# Patient Record
Sex: Female | Born: 1950
Health system: Southern US, Community
[De-identification: ages and names within clinical notes are randomized; demographics above are authoritative.]

## PROBLEM LIST (undated history)

## (undated) DIAGNOSIS — H43812 Vitreous degeneration, left eye: Secondary | ICD-10-CM

## (undated) DIAGNOSIS — C801 Malignant (primary) neoplasm, unspecified: Secondary | ICD-10-CM

## (undated) DIAGNOSIS — Z01419 Encounter for gynecological examination (general) (routine) without abnormal findings: Secondary | ICD-10-CM

## (undated) DIAGNOSIS — T7840XA Allergy, unspecified, initial encounter: Secondary | ICD-10-CM

## (undated) DIAGNOSIS — C50919 Malignant neoplasm of unspecified site of unspecified female breast: Secondary | ICD-10-CM

## (undated) DIAGNOSIS — E039 Hypothyroidism, unspecified: Secondary | ICD-10-CM

## (undated) DIAGNOSIS — Z923 Personal history of irradiation: Secondary | ICD-10-CM

## (undated) DIAGNOSIS — K921 Melena: Secondary | ICD-10-CM

## (undated) DIAGNOSIS — E785 Hyperlipidemia, unspecified: Secondary | ICD-10-CM

## (undated) DIAGNOSIS — M810 Age-related osteoporosis without current pathological fracture: Secondary | ICD-10-CM

## (undated) DIAGNOSIS — K219 Gastro-esophageal reflux disease without esophagitis: Secondary | ICD-10-CM

## (undated) HISTORY — DX: Hyperlipidemia, unspecified: E78.5

## (undated) HISTORY — PX: BREAST EXCISIONAL BIOPSY: SUR124

## (undated) HISTORY — DX: Encounter for gynecological examination (general) (routine) without abnormal findings: Z01.419

## (undated) HISTORY — DX: Gastro-esophageal reflux disease without esophagitis: K21.9

## (undated) HISTORY — DX: Malignant (primary) neoplasm, unspecified: C80.1

## (undated) HISTORY — DX: Hypothyroidism, unspecified: E03.9

## (undated) HISTORY — DX: Vitreous degeneration, left eye: H43.812

## (undated) HISTORY — DX: Age-related osteoporosis without current pathological fracture: M81.0

## (undated) HISTORY — PX: BLADDER SURGERY: SHX569

## (undated) HISTORY — DX: Allergy, unspecified, initial encounter: T78.40XA

## (undated) HISTORY — PX: BREAST BIOPSY: SHX20

## (undated) HISTORY — DX: Melena: K92.1

---
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
## 1996-06-27 DIAGNOSIS — C801 Malignant (primary) neoplasm, unspecified: Secondary | ICD-10-CM

## 1996-06-27 HISTORY — DX: Malignant (primary) neoplasm, unspecified: C80.1

## 1996-06-27 HISTORY — PX: BREAST LUMPECTOMY: SHX2

## 1997-10-10 ENCOUNTER — Ambulatory Visit (HOSPITAL_COMMUNITY): Admission: RE | Admit: 1997-10-10 | Discharge: 1997-10-10 | Payer: Self-pay | Admitting: Oncology

## 1997-10-13 ENCOUNTER — Ambulatory Visit (HOSPITAL_COMMUNITY): Admission: RE | Admit: 1997-10-13 | Discharge: 1997-10-13 | Payer: Self-pay | Admitting: *Deleted

## 1997-11-10 ENCOUNTER — Other Ambulatory Visit: Admission: RE | Admit: 1997-11-10 | Discharge: 1997-11-10 | Payer: Self-pay | Admitting: *Deleted

## 1998-03-11 ENCOUNTER — Other Ambulatory Visit: Admission: RE | Admit: 1998-03-11 | Discharge: 1998-03-11 | Payer: Self-pay | Admitting: *Deleted

## 1998-03-30 ENCOUNTER — Ambulatory Visit (HOSPITAL_COMMUNITY): Admission: RE | Admit: 1998-03-30 | Discharge: 1998-03-30 | Payer: Self-pay | Admitting: *Deleted

## 1998-10-14 ENCOUNTER — Ambulatory Visit (HOSPITAL_COMMUNITY): Admission: RE | Admit: 1998-10-14 | Discharge: 1998-10-14 | Payer: Self-pay | Admitting: *Deleted

## 1998-10-14 ENCOUNTER — Encounter: Payer: Self-pay | Admitting: *Deleted

## 1998-11-16 ENCOUNTER — Other Ambulatory Visit: Admission: RE | Admit: 1998-11-16 | Discharge: 1998-11-16 | Payer: Self-pay | Admitting: *Deleted

## 1999-03-29 ENCOUNTER — Encounter: Payer: Self-pay | Admitting: Oncology

## 1999-03-29 ENCOUNTER — Ambulatory Visit (HOSPITAL_COMMUNITY): Admission: RE | Admit: 1999-03-29 | Discharge: 1999-03-29 | Payer: Self-pay | Admitting: Oncology

## 2000-03-01 ENCOUNTER — Encounter: Admission: RE | Admit: 2000-03-01 | Discharge: 2000-03-01 | Payer: Self-pay | Admitting: Family Medicine

## 2000-03-01 ENCOUNTER — Encounter: Payer: Self-pay | Admitting: Family Medicine

## 2000-08-09 ENCOUNTER — Encounter: Admission: RE | Admit: 2000-08-09 | Discharge: 2000-08-09 | Payer: Self-pay | Admitting: Internal Medicine

## 2000-08-09 ENCOUNTER — Encounter (HOSPITAL_BASED_OUTPATIENT_CLINIC_OR_DEPARTMENT_OTHER): Payer: Self-pay | Admitting: General Surgery

## 2001-01-11 ENCOUNTER — Other Ambulatory Visit: Admission: RE | Admit: 2001-01-11 | Discharge: 2001-01-11 | Payer: Self-pay | Admitting: *Deleted

## 2001-09-04 ENCOUNTER — Encounter: Admission: RE | Admit: 2001-09-04 | Discharge: 2001-09-04 | Payer: Self-pay | Admitting: General Surgery

## 2001-09-04 ENCOUNTER — Encounter (HOSPITAL_BASED_OUTPATIENT_CLINIC_OR_DEPARTMENT_OTHER): Payer: Self-pay | Admitting: General Surgery

## 2002-05-13 ENCOUNTER — Other Ambulatory Visit: Admission: RE | Admit: 2002-05-13 | Discharge: 2002-05-13 | Payer: Self-pay | Admitting: *Deleted

## 2002-09-16 ENCOUNTER — Encounter: Payer: Self-pay | Admitting: *Deleted

## 2002-09-16 ENCOUNTER — Encounter: Admission: RE | Admit: 2002-09-16 | Discharge: 2002-09-16 | Payer: Self-pay | Admitting: *Deleted

## 2003-08-07 ENCOUNTER — Other Ambulatory Visit: Admission: RE | Admit: 2003-08-07 | Discharge: 2003-08-07 | Payer: Self-pay | Admitting: *Deleted

## 2003-09-17 ENCOUNTER — Encounter: Admission: RE | Admit: 2003-09-17 | Discharge: 2003-09-17 | Payer: Self-pay | Admitting: Oncology

## 2004-01-14 ENCOUNTER — Encounter: Admission: RE | Admit: 2004-01-14 | Discharge: 2004-01-14 | Payer: Self-pay | Admitting: *Deleted

## 2004-08-17 ENCOUNTER — Other Ambulatory Visit: Admission: RE | Admit: 2004-08-17 | Discharge: 2004-08-17 | Payer: Self-pay | Admitting: *Deleted

## 2004-09-20 ENCOUNTER — Encounter: Admission: RE | Admit: 2004-09-20 | Discharge: 2004-09-20 | Payer: Self-pay | Admitting: Oncology

## 2005-01-31 ENCOUNTER — Ambulatory Visit: Payer: Self-pay | Admitting: Oncology

## 2005-05-10 ENCOUNTER — Ambulatory Visit: Payer: Self-pay | Admitting: Family Medicine

## 2005-07-19 ENCOUNTER — Ambulatory Visit: Payer: Self-pay | Admitting: Family Medicine

## 2005-08-03 ENCOUNTER — Ambulatory Visit: Payer: Self-pay | Admitting: Family Medicine

## 2005-09-15 ENCOUNTER — Ambulatory Visit: Payer: Self-pay | Admitting: Gastroenterology

## 2005-09-27 ENCOUNTER — Ambulatory Visit: Payer: Self-pay | Admitting: Gastroenterology

## 2005-09-27 LAB — HM COLONOSCOPY

## 2005-09-28 ENCOUNTER — Encounter: Admission: RE | Admit: 2005-09-28 | Discharge: 2005-09-28 | Payer: Self-pay | Admitting: Oncology

## 2005-09-28 ENCOUNTER — Ambulatory Visit: Payer: Self-pay | Admitting: Family Medicine

## 2005-10-26 ENCOUNTER — Encounter: Payer: Self-pay | Admitting: *Deleted

## 2005-12-16 ENCOUNTER — Other Ambulatory Visit: Admission: RE | Admit: 2005-12-16 | Discharge: 2005-12-16 | Payer: Self-pay | Admitting: Obstetrics and Gynecology

## 2006-01-26 ENCOUNTER — Ambulatory Visit: Payer: Self-pay | Admitting: Family Medicine

## 2006-01-30 ENCOUNTER — Ambulatory Visit: Payer: Self-pay | Admitting: Oncology

## 2006-02-01 LAB — CBC WITH DIFFERENTIAL/PLATELET
Basophils Absolute: 0 10*3/uL (ref 0.0–0.1)
EOS%: 2.9 % (ref 0.0–7.0)
HCT: 37.4 % (ref 34.8–46.6)
HGB: 12.9 g/dL (ref 11.6–15.9)
LYMPH%: 37.4 % (ref 14.0–48.0)
MCH: 32 pg (ref 26.0–34.0)
NEUT%: 51.3 % (ref 39.6–76.8)
Platelets: 300 10*3/uL (ref 145–400)
lymph#: 2.4 10*3/uL (ref 0.9–3.3)

## 2006-02-01 LAB — COMPREHENSIVE METABOLIC PANEL
AST: 16 U/L (ref 0–37)
BUN: 15 mg/dL (ref 6–23)
CO2: 28 mEq/L (ref 19–32)
Calcium: 9.3 mg/dL (ref 8.4–10.5)
Chloride: 104 mEq/L (ref 96–112)
Creatinine, Ser: 0.81 mg/dL (ref 0.40–1.20)

## 2006-02-01 LAB — LACTATE DEHYDROGENASE: LDH: 150 U/L (ref 94–250)

## 2006-04-28 ENCOUNTER — Ambulatory Visit: Payer: Self-pay | Admitting: Family Medicine

## 2006-05-17 ENCOUNTER — Ambulatory Visit: Payer: Self-pay | Admitting: Family Medicine

## 2006-05-17 LAB — CONVERTED CEMR LAB
ALT: 17 units/L (ref 0–40)
Albumin: 3.8 g/dL (ref 3.5–5.2)
Alkaline Phosphatase: 27 units/L — ABNORMAL LOW (ref 39–117)
LDL Cholesterol: 88 mg/dL (ref 0–99)
VLDL: 12 mg/dL (ref 0–40)

## 2006-08-04 ENCOUNTER — Ambulatory Visit: Payer: Self-pay | Admitting: Family Medicine

## 2006-10-02 ENCOUNTER — Encounter: Admission: RE | Admit: 2006-10-02 | Discharge: 2006-10-02 | Payer: Self-pay | Admitting: Oncology

## 2006-10-31 ENCOUNTER — Ambulatory Visit: Payer: Self-pay | Admitting: Family Medicine

## 2007-01-02 ENCOUNTER — Ambulatory Visit: Payer: Self-pay | Admitting: Family Medicine

## 2007-01-22 ENCOUNTER — Other Ambulatory Visit: Admission: RE | Admit: 2007-01-22 | Discharge: 2007-01-22 | Payer: Self-pay | Admitting: Obstetrics and Gynecology

## 2007-01-26 ENCOUNTER — Ambulatory Visit: Payer: Self-pay | Admitting: Oncology

## 2007-01-30 LAB — COMPREHENSIVE METABOLIC PANEL
Albumin: 4.4 g/dL (ref 3.5–5.2)
BUN: 10 mg/dL (ref 6–23)
CO2: 24 mEq/L (ref 19–32)
Calcium: 9.4 mg/dL (ref 8.4–10.5)
Chloride: 107 mEq/L (ref 96–112)
Glucose, Bld: 148 mg/dL — ABNORMAL HIGH (ref 70–99)
Potassium: 3.8 mEq/L (ref 3.5–5.3)

## 2007-01-30 LAB — CBC WITH DIFFERENTIAL/PLATELET
Basophils Absolute: 0 10*3/uL (ref 0.0–0.1)
Eosinophils Absolute: 0.1 10*3/uL (ref 0.0–0.5)
HGB: 12.8 g/dL (ref 11.6–15.9)
NEUT#: 2.4 10*3/uL (ref 1.5–6.5)
RDW: 12.8 % (ref 11.3–14.5)
lymph#: 1.7 10*3/uL (ref 0.9–3.3)

## 2007-02-06 ENCOUNTER — Encounter: Payer: Self-pay | Admitting: Family Medicine

## 2007-02-07 ENCOUNTER — Ambulatory Visit: Payer: Self-pay | Admitting: Family Medicine

## 2007-02-08 LAB — CONVERTED CEMR LAB
ALT: 17 units/L (ref 0–35)
AST: 20 units/L (ref 0–37)
BUN: 7 mg/dL (ref 6–23)
Basophils Absolute: 0 10*3/uL (ref 0.0–0.1)
Basophils Relative: 0.2 % (ref 0.0–1.0)
Bilirubin, Direct: 0.1 mg/dL (ref 0.0–0.3)
Chloride: 108 meq/L (ref 96–112)
Creatinine, Ser: 0.8 mg/dL (ref 0.4–1.2)
GFR calc non Af Amer: 79 mL/min
Hemoglobin: 12.4 g/dL (ref 12.0–15.0)
LDL Cholesterol: 102 mg/dL — ABNORMAL HIGH (ref 0–99)
MCHC: 34 g/dL (ref 30.0–36.0)
MCV: 92.7 fL (ref 78.0–100.0)
RBC: 3.94 M/uL (ref 3.87–5.11)
Sodium: 142 meq/L (ref 135–145)
Total Bilirubin: 0.7 mg/dL (ref 0.3–1.2)
Total CHOL/HDL Ratio: 3.3
Total Protein: 6.7 g/dL (ref 6.0–8.3)
VLDL: 17 mg/dL (ref 0–40)
WBC: 5.6 10*3/uL (ref 4.5–10.5)

## 2007-02-15 DIAGNOSIS — Z853 Personal history of malignant neoplasm of breast: Secondary | ICD-10-CM

## 2007-02-21 ENCOUNTER — Ambulatory Visit: Payer: Self-pay | Admitting: Family Medicine

## 2007-02-22 ENCOUNTER — Encounter: Admission: RE | Admit: 2007-02-22 | Discharge: 2007-02-22 | Payer: Self-pay | Admitting: Oncology

## 2007-02-28 ENCOUNTER — Ambulatory Visit: Payer: Self-pay | Admitting: Family Medicine

## 2007-02-28 DIAGNOSIS — N3000 Acute cystitis without hematuria: Secondary | ICD-10-CM

## 2007-02-28 LAB — CONVERTED CEMR LAB
Glucose, Urine, Semiquant: NEGATIVE
Specific Gravity, Urine: 1.02
Urobilinogen, UA: 0.2

## 2007-11-06 ENCOUNTER — Encounter: Admission: RE | Admit: 2007-11-06 | Discharge: 2007-11-06 | Payer: Self-pay | Admitting: Obstetrics and Gynecology

## 2008-04-01 ENCOUNTER — Ambulatory Visit: Payer: Self-pay | Admitting: Family Medicine

## 2008-04-18 ENCOUNTER — Ambulatory Visit: Payer: Self-pay | Admitting: Family Medicine

## 2008-04-18 LAB — CONVERTED CEMR LAB
Bilirubin Urine: NEGATIVE
Specific Gravity, Urine: 1.01
Urobilinogen, UA: 0.2
pH: 7

## 2008-05-15 ENCOUNTER — Ambulatory Visit: Payer: Self-pay | Admitting: Family Medicine

## 2008-05-15 LAB — CONVERTED CEMR LAB
Ketones, urine, test strip: NEGATIVE
WBC Urine, dipstick: NEGATIVE
pH: 6

## 2008-05-16 LAB — CONVERTED CEMR LAB
ALT: 19 units/L (ref 0–35)
AST: 23 units/L (ref 0–37)
Albumin: 4 g/dL (ref 3.5–5.2)
Alkaline Phosphatase: 28 units/L — ABNORMAL LOW (ref 39–117)
BUN: 13 mg/dL (ref 6–23)
Basophils Absolute: 0 10*3/uL (ref 0.0–0.1)
Basophils Relative: 0.3 % (ref 0.0–3.0)
Bilirubin, Direct: 0.1 mg/dL (ref 0.0–0.3)
CO2: 28 meq/L (ref 19–32)
Calcium: 9.8 mg/dL (ref 8.4–10.5)
Chloride: 107 meq/L (ref 96–112)
Cholesterol: 170 mg/dL (ref 0–200)
Creatinine, Ser: 0.7 mg/dL (ref 0.4–1.2)
Eosinophils Absolute: 0.2 10*3/uL (ref 0.0–0.7)
Eosinophils Relative: 3 % (ref 0.0–5.0)
GFR calc Af Amer: 112 mL/min
GFR calc non Af Amer: 92 mL/min
Glucose, Bld: 89 mg/dL (ref 70–99)
HCT: 37.6 % (ref 36.0–46.0)
HDL: 66.1 mg/dL (ref >39.0)
Hemoglobin: 13 g/dL (ref 12.0–15.0)
LDL Cholesterol: 93 mg/dL (ref 0–99)
Lymphocytes Relative: 34.8 % (ref 12.0–46.0)
MCHC: 34.6 g/dL (ref 30.0–36.0)
MCV: 96 fL (ref 78.0–100.0)
Monocytes Absolute: 0.6 10*3/uL (ref 0.1–1.0)
Monocytes Relative: 8.4 % (ref 3.0–12.0)
Neutro Abs: 3.8 10*3/uL (ref 1.4–7.7)
Neutrophils Relative %: 53.5 % (ref 43.0–77.0)
Platelets: 251 10*3/uL (ref 150–400)
Potassium: 4.6 meq/L (ref 3.5–5.1)
RBC: 3.91 M/uL (ref 3.87–5.11)
RDW: 12.1 % (ref 11.5–14.6)
Sodium: 145 meq/L (ref 135–145)
TSH: 2.51 microintl units/mL (ref 0.35–5.50)
Total Bilirubin: 0.6 mg/dL (ref 0.3–1.2)
Total CHOL/HDL Ratio: 2.6
Total Protein: 6.8 g/dL (ref 6.0–8.3)
Triglycerides: 55 mg/dL (ref 0–149)
VLDL: 11 mg/dL (ref 0–40)
WBC: 7 10*3/uL (ref 4.5–10.5)

## 2008-05-27 ENCOUNTER — Other Ambulatory Visit: Admission: RE | Admit: 2008-05-27 | Discharge: 2008-05-27 | Payer: Self-pay | Admitting: Obstetrics and Gynecology

## 2008-06-02 ENCOUNTER — Ambulatory Visit: Payer: Self-pay | Admitting: Family Medicine

## 2008-06-02 DIAGNOSIS — M810 Age-related osteoporosis without current pathological fracture: Secondary | ICD-10-CM

## 2008-06-02 DIAGNOSIS — K219 Gastro-esophageal reflux disease without esophagitis: Secondary | ICD-10-CM | POA: Insufficient documentation

## 2008-06-02 DIAGNOSIS — J309 Allergic rhinitis, unspecified: Secondary | ICD-10-CM | POA: Insufficient documentation

## 2008-06-02 DIAGNOSIS — E785 Hyperlipidemia, unspecified: Secondary | ICD-10-CM | POA: Insufficient documentation

## 2008-06-04 ENCOUNTER — Ambulatory Visit: Payer: Self-pay | Admitting: Internal Medicine

## 2008-06-04 ENCOUNTER — Encounter: Payer: Self-pay | Admitting: Family Medicine

## 2008-08-25 LAB — HM MAMMOGRAPHY

## 2008-12-22 ENCOUNTER — Encounter: Admission: RE | Admit: 2008-12-22 | Discharge: 2008-12-22 | Payer: Self-pay | Admitting: Obstetrics and Gynecology

## 2009-03-30 ENCOUNTER — Encounter: Payer: Self-pay | Admitting: Family Medicine

## 2009-04-27 ENCOUNTER — Ambulatory Visit: Payer: Self-pay | Admitting: Family Medicine

## 2009-04-27 LAB — CONVERTED CEMR LAB
Glucose, Urine, Semiquant: NEGATIVE
Ketones, urine, test strip: NEGATIVE
Nitrite: NEGATIVE
Specific Gravity, Urine: <1.005
pH: 6

## 2009-05-04 ENCOUNTER — Ambulatory Visit: Payer: Self-pay | Admitting: Family Medicine

## 2009-05-04 LAB — CONVERTED CEMR LAB
Glucose, Urine, Semiquant: NEGATIVE
Nitrite: NEGATIVE
Protein, U semiquant: NEGATIVE
Specific Gravity, Urine: <1.005
Urobilinogen, UA: 0.2
pH: 5.5

## 2009-05-05 ENCOUNTER — Telehealth: Payer: Self-pay | Admitting: Family Medicine

## 2009-05-05 ENCOUNTER — Encounter: Payer: Self-pay | Admitting: Family Medicine

## 2009-05-19 ENCOUNTER — Ambulatory Visit: Payer: Self-pay | Admitting: Family Medicine

## 2009-05-20 LAB — CONVERTED CEMR LAB
Albumin: 4.1 g/dL (ref 3.5–5.2)
Alkaline Phosphatase: 20 units/L — ABNORMAL LOW (ref 39–117)
BUN: 7 mg/dL (ref 6–23)
Basophils Relative: 0.6 % (ref 0.0–3.0)
Calcium: 9 mg/dL (ref 8.4–10.5)
Chloride: 108 meq/L (ref 96–112)
Creatinine, Ser: 0.8 mg/dL (ref 0.4–1.2)
Glucose, Bld: 85 mg/dL (ref 70–99)
Hemoglobin: 12.8 g/dL (ref 12.0–15.0)
Lymphocytes Relative: 48.9 % — ABNORMAL HIGH (ref 12.0–46.0)
Lymphs Abs: 2.5 10*3/uL (ref 0.7–4.0)
Monocytes Absolute: 0.5 10*3/uL (ref 0.1–1.0)
Monocytes Relative: 10.3 % (ref 3.0–12.0)
Neutrophils Relative %: 40 % — ABNORMAL LOW (ref 43.0–77.0)
Nitrite: NEGATIVE
Potassium: 4.1 meq/L (ref 3.5–5.1)
RBC: 3.88 M/uL (ref 3.87–5.11)
Specific Gravity, Urine: 1.015
TSH: 3.39 microintl units/mL (ref 0.35–5.50)
Total CHOL/HDL Ratio: 3
Urobilinogen, UA: 0.2

## 2009-05-28 ENCOUNTER — Encounter: Payer: Self-pay | Admitting: Family Medicine

## 2009-05-28 ENCOUNTER — Other Ambulatory Visit: Admission: RE | Admit: 2009-05-28 | Discharge: 2009-05-28 | Payer: Self-pay | Admitting: Obstetrics and Gynecology

## 2009-05-28 ENCOUNTER — Emergency Department (HOSPITAL_COMMUNITY): Admission: EM | Admit: 2009-05-28 | Discharge: 2009-05-28 | Payer: Self-pay | Admitting: Emergency Medicine

## 2009-05-28 LAB — CONVERTED CEMR LAB: Pap Smear: NORMAL

## 2009-05-29 ENCOUNTER — Telehealth: Payer: Self-pay | Admitting: Family Medicine

## 2009-06-03 ENCOUNTER — Ambulatory Visit: Payer: Self-pay | Admitting: Family Medicine

## 2009-06-09 LAB — CONVERTED CEMR LAB: Vit D, 25-Hydroxy: 34 ng/mL (ref 30–89)

## 2009-08-10 ENCOUNTER — Ambulatory Visit: Payer: Self-pay | Admitting: Family Medicine

## 2009-08-10 DIAGNOSIS — R300 Dysuria: Secondary | ICD-10-CM

## 2009-08-10 DIAGNOSIS — N318 Other neuromuscular dysfunction of bladder: Secondary | ICD-10-CM

## 2009-08-10 LAB — CONVERTED CEMR LAB
Bilirubin Urine: NEGATIVE
Glucose, Urine, Semiquant: NEGATIVE
Ketones, urine, test strip: NEGATIVE
Protein, U semiquant: NEGATIVE
Specific Gravity, Urine: 1.01
Urobilinogen, UA: 0.2

## 2009-08-17 ENCOUNTER — Telehealth: Payer: Self-pay | Admitting: Family Medicine

## 2009-09-08 ENCOUNTER — Encounter: Payer: Self-pay | Admitting: Family Medicine

## 2009-10-14 ENCOUNTER — Ambulatory Visit: Payer: Self-pay | Admitting: Internal Medicine

## 2009-10-14 ENCOUNTER — Telehealth: Payer: Self-pay | Admitting: *Deleted

## 2009-10-14 DIAGNOSIS — J069 Acute upper respiratory infection, unspecified: Secondary | ICD-10-CM

## 2009-12-30 ENCOUNTER — Encounter: Admission: RE | Admit: 2009-12-30 | Discharge: 2009-12-30 | Payer: Self-pay | Admitting: Obstetrics and Gynecology

## 2010-01-04 ENCOUNTER — Encounter: Admission: RE | Admit: 2010-01-04 | Discharge: 2010-01-04 | Payer: Self-pay | Admitting: Obstetrics and Gynecology

## 2010-03-17 ENCOUNTER — Telehealth: Payer: Self-pay | Admitting: Family Medicine

## 2010-04-12 ENCOUNTER — Ambulatory Visit: Payer: Self-pay | Admitting: Family Medicine

## 2010-04-12 DIAGNOSIS — J019 Acute sinusitis, unspecified: Secondary | ICD-10-CM

## 2010-04-30 ENCOUNTER — Ambulatory Visit: Payer: Self-pay | Admitting: Family Medicine

## 2010-05-12 ENCOUNTER — Ambulatory Visit: Payer: Self-pay | Admitting: Family Medicine

## 2010-05-12 DIAGNOSIS — IMO0002 Reserved for concepts with insufficient information to code with codable children: Secondary | ICD-10-CM

## 2010-06-10 ENCOUNTER — Ambulatory Visit: Payer: Self-pay | Admitting: Family Medicine

## 2010-06-10 DIAGNOSIS — J209 Acute bronchitis, unspecified: Secondary | ICD-10-CM

## 2010-06-18 ENCOUNTER — Ambulatory Visit: Payer: Self-pay | Admitting: Family Medicine

## 2010-06-18 LAB — CONVERTED CEMR LAB
Bilirubin Urine: NEGATIVE
Ketones, urine, test strip: NEGATIVE
WBC Urine, dipstick: NEGATIVE
pH: 5.5

## 2010-06-29 LAB — CONVERTED CEMR LAB
AST: 33 units/L (ref 0–37)
Basophils Absolute: 0 10*3/uL (ref 0.0–0.1)
Basophils Relative: 0.6 % (ref 0.0–3.0)
Bilirubin, Direct: 0 mg/dL (ref 0.0–0.3)
CO2: 29 meq/L (ref 19–32)
Calcium: 9.3 mg/dL (ref 8.4–10.5)
Eosinophils Absolute: 0.3 10*3/uL (ref 0.0–0.7)
GFR calc non Af Amer: 82 mL/min (ref >60.00)
HCT: 38.3 % (ref 36.0–46.0)
HDL: 51.6 mg/dL (ref >39.00)
Hemoglobin: 13.1 g/dL (ref 12.0–15.0)
Monocytes Absolute: 0.5 10*3/uL (ref 0.1–1.0)
Monocytes Relative: 9.2 % (ref 3.0–12.0)
Neutro Abs: 2.7 10*3/uL (ref 1.4–7.7)
Platelets: 316 10*3/uL (ref 150.0–400.0)
RBC: 4.02 M/uL (ref 3.87–5.11)
RDW: 13.5 % (ref 11.5–14.6)
Sodium: 140 meq/L (ref 135–145)
Total CHOL/HDL Ratio: 4
VLDL: 17.4 mg/dL (ref 0.0–40.0)

## 2010-07-05 ENCOUNTER — Ambulatory Visit
Admission: RE | Admit: 2010-07-05 | Discharge: 2010-07-05 | Payer: Self-pay | Source: Home / Self Care | Attending: Family Medicine | Admitting: Family Medicine

## 2010-07-05 ENCOUNTER — Encounter: Payer: Self-pay | Admitting: Family Medicine

## 2010-07-12 ENCOUNTER — Encounter: Payer: Self-pay | Admitting: Family Medicine

## 2010-07-19 ENCOUNTER — Ambulatory Visit
Admission: RE | Admit: 2010-07-19 | Discharge: 2010-07-19 | Payer: Self-pay | Source: Home / Self Care | Attending: Family Medicine | Admitting: Family Medicine

## 2010-07-27 NOTE — Consult Note (Signed)
Summary: [facility]  [facility]   Imported By: Maryln Gottron 09/17/2009 13:43:11  _____________________________________________________________________  External Attachment:    Type:   Image     Comment:   External Document

## 2010-07-27 NOTE — Progress Notes (Signed)
Summary: antibiotic  Phone Note Call from Patient Call back at [facility] Phone [phone] Call back at Work Phone [phone]   Summary of Call: Sinus infection & congestion.  Post nasal drip.   Sore throat, so sore she's having to take pain med, Ibuprofen, that helps.  Mucinex, OTC sinus med.  T100.  Can you order antibotic as before when I saw Dr. Abner Greenspan.  Tomorrow she would need late pm appt or Fri am if ov necessary.  [facility].  [facility].   Initial call taken by: Rudy Jew, RN,  March 17, 2010 4:02 PM  Follow-up for Phone Call        call in a Zpack Follow-up by: Nelwyn Salisbury MD,  March 17, 2010 5:03 PM  Additional Follow-up for Phone Call Additional follow up Details #1::        done pt notifed. Additional Follow-up by: Pura Spice, RN,  March 17, 2010 5:09 PM    New/Updated Medications: AZITHROMYCIN 250 MG  TABS (AZITHROMYCIN) 2 by  mouth today and then 1 daily for 4 days Prescriptions: AZITHROMYCIN 250 MG  TABS (AZITHROMYCIN) 2 by  mouth today and then 1 daily for 4 days  #6 x 0   Entered by:   Pura Spice, RN   Authorized by:   Nelwyn Salisbury MD   Signed by:   Pura Spice, RN on 03/17/2010   Method used:   [facility]ron[facility] to        [facility]  [phone]* (retail)       [address] [place], [place]  [address]       Ph: [phone] or [phone]       Fax: [phone]   RxID:   [id number]

## 2010-07-27 NOTE — Assessment & Plan Note (Signed)
Summary: sinuses//ccm   Vital Signs:  Patient profile:   60 year old female Weight:      113 pounds O2 Sat:      98 % Temp:     98.1 degrees F Pulse rate:   87 / minute BP sitting:   130 / 76  (left arm)  Vitals Entered By: Pura Spice, RN (May 12, 2010 3:10 PM) CC: having difficulty with antibiotics levaquin   History of Present Illness: Here to ask about possible side effects from Levaquin. She has taken 12 days of this, and her sinus symptoms are all gone. However several days ago she developed some mild tingling sensations and vibratory sensations in the arms and legs. She has never felt this before. No vision changes.   Allergies: 1)  ! * Mycins 2)  ! * Levaquin  Past History:  Past Medical History: Reviewed history from 06/03/2009 and no changes required. Breast cancer, hx of, found in 1998, sees Dr. Cyndie Chime Blood in Stool UTI's, sess Dr. Vernie Ammons sees Dr. Artist Pais  for GYN exams Osteoporosis per DEXA 06-04-08, T scores from -2.6 to -2.0 Allergic rhinitis GERD Hyperlipidemia  Review of Systems  The patient denies anorexia, fever, weight loss, weight gain, vision loss, decreased hearing, hoarseness, chest pain, syncope, dyspnea on exertion, peripheral edema, prolonged cough, headaches, hemoptysis, abdominal pain, melena, hematochezia, severe indigestion/heartburn, hematuria, incontinence, genital sores, muscle weakness, suspicious skin lesions, transient blindness, difficulty walking, depression, unusual weight change, abnormal bleeding, enlarged lymph nodes, angioedema, breast masses, and testicular masses.    Physical Exam  General:  Well-developed,well-nourished,in no acute distress; alert,appropriate and cooperative throughout examination Head:  Normocephalic and atraumatic without obvious abnormalities. No apparent alopecia or balding. Eyes:  No corneal or conjunctival inflammation noted. EOMI. Perrla. Funduscopic exam benign, without hemorrhages,  exudates or papilledema. Vision grossly normal. Ears:  External ear exam shows no significant lesions or deformities.  Otoscopic examination reveals clear canals, tympanic membranes are intact bilaterally without bulging, retraction, inflammation or discharge. Hearing is grossly normal bilaterally. Nose:  External nasal examination shows no deformity or inflammation. Nasal mucosa are pink and moist without lesions or exudates. Mouth:  Oral mucosa and oropharynx without lesions or exudates.  Teeth in good repair. Neck:  No deformities, masses, or tenderness noted. Lungs:  Normal respiratory effort, chest expands symmetrically. Lungs are clear to auscultation, no crackles or wheezes. Heart:  Normal rate and regular rhythm. S1 and S2 normal without gallop, murmur, click, rub or other extra sounds. Neurologic:  No cranial nerve deficits noted. Station and gait are normal. Plantar reflexes are down-going bilaterally. DTRs are symmetrical throughout. Sensory, motor and coordinative functions appear intact.   Impression & Recommendations:  Problem # 1:  NEURALGIA (ICD-729.2)  Problem # 2:  ACUTE SINUSITIS, UNSPECIFIED (ICD-461.9)  Her updated medication list for this problem includes:    Levaquin 500 Mg Tabs (Levofloxacin) ..... Once daily  Complete Medication List: 1)  Zocor 20 Mg Tabs (Simvastatin) .... Once daily 2)  [facility] Loratadine 10 Mg Tabs (Loratadine) .... Once daily 3)  Omeprazole 20 Mg Cpdr (Omeprazole) .Marland Kitchen.. 1 by mouth once daily 4)  Multivitamins Tabs (Multiple vitamin) .Marland Kitchen.. 1 by mouth once daily 5)  Oscal 500/200 D-3 500-200 Mg-unit Tabs (Calcium-vitamin d) .Marland Kitchen.. 1 by mouth once daily 6)  Boniva 150 Mg Tabs (Ibandronate sodium) .... Once a month 7)  Temazepam 30 Mg Caps (Temazepam) .... At bedtime as needed sleep 8)  Fish Oil Oil (Fish oil) .... Once daily 9)  Levaquin 500 Mg Tabs (Levofloxacin) .... Once daily  Patient Instructions: 1)  These symptoms appear to be side effects of  the Levaquin, so we will stop this today. These symptoms should then resolve in several days. if not, she will return and we will work them up further. The sinusitis seems to be resolved at this point.    Orders Added: 1)  Est. Patient Level IV [87564]

## 2010-07-27 NOTE — Assessment & Plan Note (Signed)
Summary: SINUSITIS // RS/pt rescd//ccm   Vital Signs:  Patient profile:   60 year old female Height:      64 inches Weight:      116 pounds BMI:     19.98 Temp:     99.0 degrees F oral BP sitting:   120 / 78  (left arm) Cuff size:   regular  Vitals Entered By: Kern Reap CMA Duncan Dull) (April 30, 2010 3:18 PM) CC: cough, congestion, sinus pressure   History of Present Illness: Here with recurrent sinusitis symtpoms, inlcuding sinus pressure, HA, PND, and a dry cough. No fever. She took a course of Augmentin a few weeks ago, and this helped a lot, althogh she never got quite well. Now the same symtpoms have returned.   Allergies: 1)  ! * Mycins  Past History:  Past Medical History: Reviewed history from 06/03/2009 and no changes required. Breast cancer, hx of, found in 1998, sees Dr. Cyndie Chime Blood in Stool UTI's, sess Dr. Vernie Ammons sees Dr. Artist Pais  for GYN exams Osteoporosis per DEXA 06-04-08, T scores from -2.6 to -2.0 Allergic rhinitis GERD Hyperlipidemia  Review of Systems  The patient denies anorexia, fever, weight loss, weight gain, vision loss, decreased hearing, hoarseness, chest pain, syncope, dyspnea on exertion, peripheral edema, hemoptysis, abdominal pain, melena, hematochezia, severe indigestion/heartburn, hematuria, incontinence, genital sores, muscle weakness, suspicious skin lesions, transient blindness, difficulty walking, depression, unusual weight change, abnormal bleeding, enlarged lymph nodes, angioedema, breast masses, and testicular masses.    Physical Exam  General:  Well-developed,well-nourished,in no acute distress; alert,appropriate and cooperative throughout examination Head:  Normocephalic and atraumatic without obvious abnormalities. No apparent alopecia or balding. Eyes:  No corneal or conjunctival inflammation noted. EOMI. Perrla. Funduscopic exam benign, without hemorrhages, exudates or papilledema. Vision grossly normal. Ears:   External ear exam shows no significant lesions or deformities.  Otoscopic examination reveals clear canals, tympanic membranes are intact bilaterally without bulging, retraction, inflammation or discharge. Hearing is grossly normal bilaterally. Nose:  External nasal examination shows no deformity or inflammation. Nasal mucosa are pink and moist without lesions or exudates. Mouth:  Oral mucosa and oropharynx without lesions or exudates.  Teeth in good repair. Neck:  No deformities, masses, or tenderness noted. Lungs:  Normal respiratory effort, chest expands symmetrically. Lungs are clear to auscultation, no crackles or wheezes.   Impression & Recommendations:  Problem # 1:  ACUTE SINUSITIS, UNSPECIFIED (ICD-461.9)  Her updated medication list for this problem includes:    Levaquin 500 Mg Tabs (Levofloxacin) ..... Once daily  Complete Medication List: 1)  Zocor 20 Mg Tabs (Simvastatin) .... Once daily 2)  [facility] Loratadine 10 Mg Tabs (Loratadine) .... Once daily 3)  Omeprazole 20 Mg Cpdr (Omeprazole) .Marland Kitchen.. 1 by mouth once daily 4)  Multivitamins Tabs (Multiple vitamin) .Marland Kitchen.. 1 by mouth once daily 5)  Oscal 500/200 D-3 500-200 Mg-unit Tabs (Calcium-vitamin d) .Marland Kitchen.. 1 by mouth once daily 6)  Boniva 150 Mg Tabs (Ibandronate sodium) .... Once a month 7)  Temazepam 30 Mg Caps (Temazepam) .... At bedtime as needed sleep 8)  Fish Oil Oil (Fish oil) .... Once daily 9)  Levaquin 500 Mg Tabs (Levofloxacin) .... Once daily  Patient Instructions: 1)  Try Levaquin. We will have her stay out of work until 05-04-10.  2)  Please schedule a follow-up appointment as needed .  Prescriptions: LEVAQUIN 500 MG TABS (LEVOFLOXACIN) once daily  #14 x 0   Entered and Authorized by:   Nelwyn Salisbury MD  Signed by:   Nelwyn Salisbury MD on 04/30/2010   Method used:   [facility] to        [facility]  [phone]* (retail)       [address] [place], [place]  [address]       Ph: [phone] or  [phone]       Fax: [phone]   RxID:   [id number]    Orders Added: 1)  Est. Patient Level IV [36644]

## 2010-07-27 NOTE — Progress Notes (Signed)
Summary: still has frequency  Phone Note Call from Patient   Caller: Patient Call For: Nelwyn Salisbury MD Summary of Call: Vesicare didn't help, still c/o frequency. What's the next step? ph [phone] [facility] Initial call taken by: Raechel Ache, RN,  August 17, 2009 11:33 AM  Follow-up for Phone Call        refer her to Urology for frequency and frequent UTIs Follow-up by: Nelwyn Salisbury MD,  August 17, 2009 2:13 PM  Additional Follow-up for Phone Call Additional follow up Details #1::        Phone Call Completed Additional Follow-up by: Alfred Levins, CMA,  August 17, 2009 4:42 PM

## 2010-07-27 NOTE — Progress Notes (Signed)
Summary: wants ov or abx  Phone Note Call from Patient Call back at Home Phone [phone]   Caller: Patient---live call Summary of Call: Has a sinus infection. Discoloration of mucus. She has a history of these. [facility] ov [facility]. Initial call taken by: Warnell Forester,  October 14, 2009 9:06 AM  Follow-up for Phone Call        has had bad allergies- takes Claritin and mucinex- past week has gotten worse with low grade fever, prod cough with yellow mucus, nasal cong and achy. Follow-up by: Raechel Ache, RN,  October 14, 2009 9:13 AM  Additional Follow-up for Phone Call Additional follow up Details #1::        Pt to come in at 11:45am  Additional Follow-up by: Romualdo Bolk, CMA (AAMA),  October 14, 2009 9:23 AM

## 2010-07-27 NOTE — Assessment & Plan Note (Signed)
Summary: sinus inf/cjr   Vital Signs:  Patient profile:   60 year old female Weight:      117 pounds O2 Sat:      100 % Temp:     98.6 degrees F Pulse rate:   92 / minute BP sitting:   126 / 76  (left arm) Cuff size:   regular  Vitals Entered By: Pura Spice, RN (April 12, 2010 3:54 PM) CC: sinus infection    History of Present Illness: Here for a recurrance of sinus pressure, PND, HA, and a dry cough. No fever. One month ago she took a Zpack, and it seemed to work for about one week, then her symptoms came back. On Mucinex.   Allergies: 1)  ! * Mycins  Past History:  Past Medical History: Reviewed history from 06/03/2009 and no changes required. Breast cancer, hx of, found in 1998, sees Dr. Cyndie Chime Blood in Stool UTI's, sess Dr. Vernie Ammons sees Dr. Artist Pais  for GYN exams Osteoporosis per DEXA 06-04-08, T scores from -2.6 to -2.0 Allergic rhinitis GERD Hyperlipidemia  Review of Systems  The patient denies anorexia, fever, weight loss, weight gain, vision loss, decreased hearing, hoarseness, chest pain, syncope, dyspnea on exertion, peripheral edema, hemoptysis, abdominal pain, melena, hematochezia, severe indigestion/heartburn, hematuria, incontinence, genital sores, muscle weakness, suspicious skin lesions, transient blindness, difficulty walking, depression, unusual weight change, abnormal bleeding, enlarged lymph nodes, angioedema, breast masses, and testicular masses.    Physical Exam  General:  Well-developed,well-nourished,in no acute distress; alert,appropriate and cooperative throughout examination Head:  Normocephalic and atraumatic without obvious abnormalities. No apparent alopecia or balding. Eyes:  No corneal or conjunctival inflammation noted. EOMI. Perrla. Funduscopic exam benign, without hemorrhages, exudates or papilledema. Vision grossly normal. Ears:  External ear exam shows no significant lesions or deformities.  Otoscopic examination reveals  clear canals, tympanic membranes are intact bilaterally without bulging, retraction, inflammation or discharge. Hearing is grossly normal bilaterally. Nose:  External nasal examination shows no deformity or inflammation. Nasal mucosa are pink and moist without lesions or exudates. Mouth:  Oral mucosa and oropharynx without lesions or exudates.  Teeth in good repair. Neck:  No deformities, masses, or tenderness noted. Lungs:  Normal respiratory effort, chest expands symmetrically. Lungs are clear to auscultation, no crackles or wheezes.   Impression & Recommendations:  Problem # 1:  ACUTE SINUSITIS, UNSPECIFIED (ICD-461.9)  The following medications were removed from the medication list:    Hydrocodone-homatropine 5-1.5 Mg/21ml Syrp (Hydrocodone-homatropine) .Marland Kitchen... 1-2 tsp by mouth q4-6 hour s as needed cough Her updated medication list for this problem includes:    Augmentin 875-125 Mg Tabs (Amoxicillin-pot clavulanate) .Marland Kitchen..Marland Kitchen Two times a day  Complete Medication List: 1)  Zocor 20 Mg Tabs (Simvastatin) .... Once daily 2)  [facility] Loratadine 10 Mg Tabs (Loratadine) .... Once daily 3)  Omeprazole 20 Mg Cpdr (Omeprazole) .Marland Kitchen.. 1 by mouth once daily 4)  Multivitamins Tabs (Multiple vitamin) .Marland Kitchen.. 1 by mouth once daily 5)  Oscal 500/200 D-3 500-200 Mg-unit Tabs (Calcium-vitamin d) .Marland Kitchen.. 1 by mouth once daily 6)  Boniva 150 Mg Tabs (Ibandronate sodium) .... Once a month 7)  Temazepam 30 Mg Caps (Temazepam) .... At bedtime as needed sleep 8)  Fish Oil Oil (Fish oil) .... Once daily 9)  Augmentin 875-125 Mg Tabs (Amoxicillin-pot clavulanate) .... Two times a day  Patient Instructions: 1)  Please schedule a follow-up appointment as needed .  Prescriptions: AUGMENTIN 875-125 MG TABS (AMOXICILLIN-POT CLAVULANATE) two times a day  #20 x  0   Entered and Authorized by:   Nelwyn Salisbury MD   Signed by:   Nelwyn Salisbury MD on 04/12/2010   Method used:   [facility]ronically to        [facility]  [phone][facility]  (retail)       [address] [place], [place]  [address]       Ph: [phone] or [phone]       Fax: [phone]   RxID:   [id number]    Orders Added: 1)  Est. Patient Level IV [42595]

## 2010-07-27 NOTE — Assessment & Plan Note (Signed)
Summary: ? uti//ccm   Vital Signs:  Patient profile:   60 year old female Weight:      119 pounds Temp:     98.0 degrees F oral Pulse rate:   65 / minute BP sitting:   132 / 68  (left arm) Cuff size:   regular  Vitals Entered By: Alfred Levins, CMA (August 10, 2009 3:54 PM) CC: UTI   History of Present Illness: Here for a week of urgency to urinate. No burning or fever. Over the past 4 months she has had courses of Cipro, then bactrim, then Macrobid for similar symptoms. Each time she felt better for awhile, then the symptoms returned. On several occasions she showed bacteria in the urine, and on several occasions she did not. She admits to not drinking much water.   Allergies: 1)  ! * Mycins  Past History:  Past Medical History: Reviewed history from 06/03/2009 and no changes required. Breast cancer, hx of, found in 1998, sees Dr. Cyndie Chime Blood in Stool UTI's, sess Dr. Vernie Ammons sees Dr. Artist Pais  for GYN exams Osteoporosis per DEXA 06-04-08, T scores from -2.6 to -2.0 Allergic rhinitis GERD Hyperlipidemia  Past Surgical History: Reviewed history from 06/02/2008 and no changes required. Lumpectomy, left breast followed by radiation Tonsillectomy colonoscopy 09-27-05 per Dr. Christella Hartigan, repeat in 5 yrs  Review of Systems  The patient denies anorexia, fever, weight loss, weight gain, vision loss, decreased hearing, hoarseness, chest pain, syncope, dyspnea on exertion, peripheral edema, prolonged cough, headaches, hemoptysis, abdominal pain, melena, hematochezia, severe indigestion/heartburn, hematuria, incontinence, genital sores, muscle weakness, suspicious skin lesions, transient blindness, difficulty walking, depression, unusual weight change, abnormal bleeding, enlarged lymph nodes, angioedema, breast masses, and testicular masses.    Physical Exam  General:  Well-developed,well-nourished,in no acute distress; alert,appropriate and cooperative throughout  examination Abdomen:  Bowel sounds positive,abdomen soft and non-tender without masses, organomegaly or hernias noted.   Impression & Recommendations:  Problem # 1:  OVERACTIVE BLADDER (ICD-596.51)  Complete Medication List: 1)  Zocor 20 Mg Tabs (Simvastatin) .... Once daily 2)  [facility] Loratadine 10 Mg Tabs (Loratadine) .... Once daily 3)  Omeprazole 20 Mg Cpdr (Omeprazole) .Marland Kitchen.. 1 by mouth once daily 4)  Multivitamins Tabs (Multiple vitamin) .Marland Kitchen.. 1 by mouth once daily 5)  Oscal 500/200 D-3 500-200 Mg-unit Tabs (Calcium-vitamin d) .Marland Kitchen.. 1 by mouth once daily 6)  Boniva 150 Mg Tabs (Ibandronate sodium) .... Once a month 7)  Temazepam 30 Mg Caps (Temazepam) .... At bedtime as needed sleep 8)  Fish Oil Oil (Fish oil) .... Once daily 9)  Vesicare 10 Mg Tabs (Solifenacin succinate) .... Once daily  Other Orders: UA Dipstick w/o Micro (manual) (42595)  Patient Instructions: 1)  There is no sign of bacteriuria today. Try Vesicare. Drink more water.  2)  Please schedule a follow-up appointment as needed .  Prescriptions: VESICARE 10 MG TABS (SOLIFENACIN SUCCINATE) once daily  #30 x 11   Entered and Authorized by:   Nelwyn Salisbury MD   Signed by:   Nelwyn Salisbury MD on 08/10/2009   Method used:   Electronically to        [facility]  [phone][facility] (retail)       [address] [place], [place]  [address]       Ph: [phone] or [phone]       Fax: [phone]   RxID:   [id number]   Laboratory Results   Urine Tests  Routine Urinalysis   Color: yellow Appearance: Clear Glucose: negative   (Normal Range: Negative) Bilirubin: negative   (Normal Range: Negative) Ketone: negative   (Normal Range: Negative) Spec. Gravity: 1.010   (Normal Range: 1.003-1.035) Blood: trace-lysed   (Normal Range: Negative) pH: 5.5   (Normal Range: 5.0-8.0) Protein: negative   (Normal Range: Negative) Urobilinogen: 0.2   (Normal Range: 0-1) Nitrite: negative   (Normal Range:  Negative) Leukocyte Esterace: negative   (Normal Range: Negative)    Comments: Rita Ohara  August 10, 2009 4:17 PM

## 2010-07-27 NOTE — Assessment & Plan Note (Signed)
Summary: sinus infection/ssc   Vital Signs:  Patient profile:   60 year old female Weight:      114 pounds Temp:     98.7 degrees F oral Pulse rate:   66 / minute BP sitting:   110 / 80  (left arm) Cuff size:   regular  Vitals Entered By: Romualdo Bolk, CMA (AAMA) (October 14, 2009 11:43 AM) CC: Sinus pressure, sore throat, drainage, coughing, congestion, low grade temp that started on 4/18   History of Present Illness: Kristy [name] comes in for acute visit.  Allergy symptom for a few weeks an then increasing ,symptom for 3-4 days.  getting worse.  has spring and fall allergies and infections andusually controlled  with  mucinex and othermeds.  This year     Dayquil and other.    is not persistent or  progressive    and now very tired.   ? low grade 99 lastpm.   no chills .  cough i getting worse.   some phelgm every hour  yellow and   brown at times.   exposed to children and stress  affecting her.    No cp sob or wheezing   No tobacco   or ets.    no asthma dx  and no hx of pneumonia .    Preventive Screening-Counseling & Management  Alcohol-Tobacco     Smoking Status: never  Caffeine-Diet-Exercise     Does Patient Exercise: no  Current Medications (verified): 1)  Zocor 20 Mg  Tabs (Simvastatin) .... Once Daily 2)  Cvs Loratadine 10 Mg  Tabs (Loratadine) .... Once Daily 3)  Omeprazole 20 Mg Cpdr (Omeprazole) .Marland Kitchen.. 1 By Mouth Once Daily 4)  Multivitamins   Tabs (Multiple Vitamin) .Marland Kitchen.. 1 By Mouth Once Daily 5)  Oscal 500/200 D-3 500-200 Mg-Unit  Tabs (Calcium-Vitamin D) .Marland Kitchen.. 1 By Mouth Once Daily 6)  Boniva 150 Mg Tabs (Ibandronate Sodium) .... Once A Month 7)  Temazepam 30 Mg Caps (Temazepam) .... At Bedtime As Needed Sleep 8)  Fish Oil   Oil (Fish Oil) .... Once Daily  Allergies (verified): 1)  ! * Mycins  Past History:  Past Medical History: Last updated: 06/03/2009 Breast cancer, hx of, found in 1998, sees Dr. Cyndie Chime Blood in Stool UTI's, sess Dr.  Vernie Ammons sees Dr. Artist Pais  for GYN exams Osteoporosis per DEXA 06-04-08, T scores from -2.6 to -2.0 Allergic rhinitis GERD Hyperlipidemia  Past Surgical History: Last updated: 06/02/2008 Lumpectomy, left breast followed by radiation Tonsillectomy colonoscopy 09-27-05 per Dr. Christella Hartigan, repeat in 5 yrs  Family History: Last updated: 02/15/2007 Family History of ALS Family History of Arthritis Family History High cholesterol Family History Hypertension Family History Psychiatric care Family History of Cardiovascular disorder  Social History: Last updated: 02/15/2007 Occupation: Runner, broadcasting/film/video Married Never Smoked Alcohol use-no Drug use-no Regular exercise-no  Review of Systems       The patient complains of fever, hoarseness, and chest pain.  The patient denies anorexia, weight loss, weight gain, vision loss, syncope, dyspnea on exertion, peripheral edema, hemoptysis, abdominal pain, abnormal bleeding, enlarged lymph nodes, and angioedema.    Physical Exam  General:  Well-developed,well-nourished,in no acute distress; alert,appropriate and cooperative throughout examinationvery deep   cough  Head:  normocephalic and atraumatic.   Eyes:  vision grossly intact and pupils equal.   Ears:  R ear normal, L ear normal, and no external deformities.   Nose:  no external deformity and no external erythema.  congestion  Mouth:  and cobblestoning  laterally and red  n Neck:  No deformities, masses, or tenderness noted. Lungs:  Normal respiratory effort, chest expands symmetrically. Lungs are clear to auscultation, no crackles or wheezes.no dullness.   Heart:  Normal rate and regular rhythm. S1 and S2 normal without gallop, murmur, click, rub or other extra sounds.no lifts.   Cervical Nodes:  tender ac nodes   not enlarged  Psych:  Oriented X3, not anxious appearing, and not depressed appearing.     Impression & Recommendations:  Problem # 1:  COUGH (ICD-786.2) prolonged  with uri ?  sinusitis or other  worsening scenario       on top of allergy  congestion  . no other alarm signs but had ? brown in phlegm such  as blood  will empirically rx  because of holidays.   Problem # 2:  URI (ICD-465.9)  Her updated medication list for this problem includes:    Cvs Loratadine 10 Mg Tabs (Loratadine) ..... Once daily    Hydrocodone-homatropine 5-1.5 Mg/41ml Syrp (Hydrocodone-homatropine) .Marland Kitchen... 1-2 tsp by mouth q4-6 hour s as needed cough  Problem # 3:  ALLERGIC RHINITIS (ICD-477.9) on going Her updated medication list for this problem includes:    Cvs Loratadine 10 Mg Tabs (Loratadine) ..... Once daily  Complete Medication List: 1)  Zocor 20 Mg Tabs (Simvastatin) .... Once daily 2)  Cvs Loratadine 10 Mg Tabs (Loratadine) .... Once daily 3)  Omeprazole 20 Mg Cpdr (Omeprazole) .Marland Kitchen.. 1 by mouth once daily 4)  Multivitamins Tabs (Multiple vitamin) .Marland Kitchen.. 1 by mouth once daily 5)  Oscal 500/200 D-3 500-200 Mg-unit Tabs (Calcium-vitamin d) .Marland Kitchen.. 1 by mouth once daily 6)  Boniva 150 Mg Tabs (Ibandronate sodium) .... Once a month 7)  Temazepam 30 Mg Caps (Temazepam) .... At bedtime as needed sleep 8)  Fish Oil Oil (Fish oil) .... Once daily 9)  Hydrocodone-homatropine 5-1.5 Mg/21ml Syrp (Hydrocodone-homatropine) .Marland Kitchen.. 1-2 tsp by mouth q4-6 hour s as needed cough 10)  Azithromycin 250 Mg Tabs (Azithromycin) .... Take 2 by mouth first dose and then 1 by mouth once daily for 4 more days  Patient Instructions: 1)  call if not improving in the next 5-7 days or as needed fever shortness of breath Prescriptions: AZITHROMYCIN 250 MG TABS (AZITHROMYCIN) take 2 by mouth first dose and then 1 by mouth once daily for 4 more days  #6 x 0   Entered and Authorized by:   Madelin Headings MD   Signed by:   Madelin Headings MD on 10/14/2009   Method used:   Electronically to        [facility]  [phone] (retail)       [address] [place], [place]  [address]       Ph: [phone] or  [phone]       Fax: [phone]   RxID:   [id number] HYDROCODONE-HOMATROPINE 5-1.5 MG/5ML SYRP (HYDROCODONE-HOMATROPINE) 1-2 tsp by mouth q4-6 hour s as needed cough  #6 oz x 0   Entered and Authorized by:   Madelin Headings MD   Signed by:   Madelin Headings MD on 10/14/2009   Method used:   Print then Give to Patient   RxID:   [id number]

## 2010-07-29 NOTE — Assessment & Plan Note (Signed)
Summary: cough/fever/njr   Vital Signs:  Patient profile:   60 year old female Weight:      114 pounds Temp:     99.3 degrees F oral BP sitting:   110 / 70  Vitals Entered By: Lynann Beaver CMA AAMA (June 10, 2010 11:30 AM) CC: sinus infection Is Patient Diabetic? No Pain Assessment Patient in pain? no        History of Present Illness: here with 3 days of fever to 100 degrees, PND, chest congestion, and coughing up green sputum.   Current Medications (verified): 1)  Zocor 20 Mg  Tabs (Simvastatin) .... Once Daily 2)  Cvs Loratadine 10 Mg  Tabs (Loratadine) .... Once Daily 3)  Omeprazole 20 Mg Cpdr (Omeprazole) .Marland Kitchen.. 1 By Mouth Once Daily 4)  Multivitamins   Tabs (Multiple Vitamin) .Marland Kitchen.. 1 By Mouth Once Daily 5)  Oscal 500/200 D-3 500-200 Mg-Unit  Tabs (Calcium-Vitamin D) .Marland Kitchen.. 1 By Mouth Once Daily 6)  Boniva 150 Mg Tabs (Ibandronate Sodium) .... Once A Month 7)  Temazepam 30 Mg Caps (Temazepam) .... At Bedtime As Needed Sleep 8)  Fish Oil   Oil (Fish Oil) .... Once Daily  Allergies (verified): 1)  ! * Mycins 2)  ! * Levaquin  Past History:  Past Medical History: Reviewed history from 06/03/2009 and no changes required. Breast cancer, hx of, found in 1998, sees Dr. Cyndie Chime Blood in Stool UTI's, sess Dr. Vernie Ammons sees Dr. Artist Pais  for GYN exams Osteoporosis per DEXA 06-04-08, T scores from -2.6 to -2.0 Allergic rhinitis GERD Hyperlipidemia  Review of Systems  The patient denies anorexia, weight loss, weight gain, vision loss, decreased hearing, hoarseness, chest pain, syncope, dyspnea on exertion, peripheral edema, headaches, hemoptysis, abdominal pain, melena, hematochezia, severe indigestion/heartburn, hematuria, incontinence, genital sores, muscle weakness, suspicious skin lesions, transient blindness, difficulty walking, depression, unusual weight change, abnormal bleeding, enlarged lymph nodes, angioedema, breast masses, and testicular masses.     Physical Exam  General:  Well-developed,well-nourished,in no acute distress; alert,appropriate and cooperative throughout examination Head:  Normocephalic and atraumatic without obvious abnormalities. No apparent alopecia or balding. Eyes:  No corneal or conjunctival inflammation noted. EOMI. Perrla. Funduscopic exam benign, without hemorrhages, exudates or papilledema. Vision grossly normal. Ears:  External ear exam shows no significant lesions or deformities.  Otoscopic examination reveals clear canals, tympanic membranes are intact bilaterally without bulging, retraction, inflammation or discharge. Hearing is grossly normal bilaterally. Nose:  External nasal examination shows no deformity or inflammation. Nasal mucosa are pink and moist without lesions or exudates. Mouth:  Oral mucosa and oropharynx without lesions or exudates.  Teeth in good repair. Neck:  No deformities, masses, or tenderness noted. Lungs:  scattered rhonchi    Impression & Recommendations:  Problem # 1:  ACUTE BRONCHITIS (ICD-466.0)  Her updated medication list for this problem includes:    Zithromax Z-pak 250 Mg Tabs (Azithromycin) .Marland Kitchen... As directed  Complete Medication List: 1)  Zocor 20 Mg Tabs (Simvastatin) .... Once daily 2)  Cvs Loratadine 10 Mg Tabs (Loratadine) .... Once daily 3)  Omeprazole 20 Mg Cpdr (Omeprazole) .Marland Kitchen.. 1 by mouth once daily 4)  Multivitamins Tabs (Multiple vitamin) .Marland Kitchen.. 1 by mouth once daily 5)  Oscal 500/200 D-3 500-200 Mg-unit Tabs (Calcium-vitamin d) .Marland Kitchen.. 1 by mouth once daily 6)  Boniva 150 Mg Tabs (Ibandronate sodium) .... Once a month 7)  Temazepam 30 Mg Caps (Temazepam) .... At bedtime as needed sleep 8)  Fish Oil Oil (Fish oil) .... Once daily 9)  Zithromax Z-pak 250 Mg Tabs (Azithromycin) .... As directed  Patient Instructions: 1)  Please schedule a follow-up appointment as needed .  Prescriptions: ZITHROMAX Z-PAK 250 MG TABS (AZITHROMYCIN) as directed  #1 x 0   Entered  and Authorized by:   Nelwyn Salisbury MD   Signed by:   Nelwyn Salisbury MD on 06/10/2010   Method used:   Electronically to        [facility]  [phone]* (retail)       [address] [place], [place]  [address]       Ph: [phone] or [phone]       Fax: [phone]   RxID:   [id number]    Orders Added: 1)  Est. Patient Level IV [60630]

## 2010-07-29 NOTE — Assessment & Plan Note (Signed)
Summary: cpx/no pap/njr   Vital Signs:  Patient profile:   60 year old female Height:      64 inches Weight:      114 pounds O2 Sat:      98 % Temp:     98.7 degrees F Pulse rate:   84 / minute BP sitting:   120 / 72  (left arm)  Vitals Entered By: Pura Spice, RN (July 05, 2010 10:46 AM) CC: cpx   No PAP  needs bone density. Sees Gyn--Dr Thomasena Edis    History of Present Illness: 60 yr old female for a cpx. She feels well and has no complaints. we recently started her on thyroid replacement.   Allergies: 1)  ! * Mycins 2)  ! * Levaquin  Past History:  Past Medical History: Reviewed history from 06/03/2009 and no changes required. Breast cancer, hx of, found in 1998, sees Dr. Cyndie Chime Blood in Stool UTI's, sess Dr. Vernie Ammons sees Dr. Artist Pais  for GYN exams Osteoporosis per DEXA 06-04-08, T scores from -2.6 to -2.0 Allergic rhinitis GERD Hyperlipidemia  Past Surgical History: Reviewed history from 06/02/2008 and no changes required. Lumpectomy, left breast followed by radiation Tonsillectomy colonoscopy 09-27-05 per Dr. Christella Hartigan, repeat in 5 yrs  Family History: Reviewed history from 02/15/2007 and no changes required. Family History of ALS Family History of Arthritis Family History High cholesterol Family History Hypertension Family History Psychiatric care Family History of Cardiovascular disorder  Social History: Reviewed history from 02/15/2007 and no changes required. 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    Physical Exam  General:  Well-developed,well-nourished,in no acute distress; alert,appropriate and cooperative throughout examination Head:  Normocephalic and atraumatic without obvious abnormalities. No apparent alopecia or balding. Eyes:  No corneal or conjunctival inflammation noted. EOMI. Perrla. Funduscopic exam benign, without hemorrhages, exudates or papilledema. Vision grossly normal. Ears:  External ear exam shows no significant lesions or deformities.  Otoscopic examination reveals clear canals, tympanic membranes are intact bilaterally without bulging, retraction, inflammation or discharge. Hearing is grossly normal bilaterally. Nose:  External nasal examination shows no deformity or inflammation. Nasal mucosa are pink and moist without lesions or exudates. Mouth:  Oral mucosa and oropharynx without lesions or exudates.  Teeth in good repair. Neck:  No deformities, masses, or tenderness noted. Chest Wall:  No deformities, masses, or tenderness noted. Lungs:  Normal respiratory effort, chest expands symmetrically. Lungs are clear to auscultation, no crackles or wheezes. Heart:  Normal rate and regular rhythm. S1 and S2 normal without gallop, murmur, click, rub or other extra sounds. EKG normal Abdomen:  Bowel sounds positive,abdomen soft and non-tender without masses, organomegaly or hernias noted. Msk:  No deformity or scoliosis noted of thoracic or lumbar spine.   Pulses:  R and L carotid,radial,femoral,dorsalis pedis and posterior tibial pulses are full and equal bilaterally Extremities:  No clubbing, cyanosis, edema, or deformity noted with normal full range of motion of all joints.   Neurologic:  No cranial nerve deficits noted. Station and gait are normal. Plantar reflexes are down-going bilaterally. DTRs are symmetrical throughout.  Sensory, motor and coordinative functions appear intact. Skin:  Intact without suspicious lesions or rashes Cervical Nodes:  No lymphadenopathy noted Axillary Nodes:  No palpable lymphadenopathy Inguinal Nodes:  No significant adenopathy Psych:  Cognition and judgment appear intact. Alert and cooperative with normal attention span and concentration. No apparent delusions, illusions, hallucinations   Impression & Recommendations:  Problem # 1:  EXAMINATION, ROUTINE MEDICAL (ICD-V70.0)  Orders: EKG w/ Interpretation (93000)  Complete Medication List: 1)  Zocor 20 Mg Tabs (Simvastatin) .... Once daily 2)  Cvs Loratadine 10 Mg Tabs (Loratadine) .... Once daily 3)  Omeprazole 20 Mg Cpdr (Omeprazole) .Marland Kitchen.. 1 by mouth once daily 4)  Multivitamins Tabs (Multiple vitamin) .Marland Kitchen.. 1 by mouth once daily 5)  Oscal 500/200 D-3 500-200 Mg-unit Tabs (Calcium-vitamin d) .Marland Kitchen.. 1 by mouth once daily 6)  Boniva 150 Mg Tabs (Ibandronate sodium) .... Once a month 7)  Temazepam 30 Mg Caps (Temazepam) .... At bedtime as needed sleep 8)  Fish Oil Oil (Fish oil) .... Once daily 9)  Synthroid 50 Mcg Tabs (Levothyroxine sodium) .Marland Kitchen.. 1 by mouth once daily  Other Orders: Radiology Referral (Radiology)  Patient Instructions: 1)  Please schedule a follow-up appointment in 3 months .  2)  set up another DEXA Prescriptions: TEMAZEPAM 30 MG CAPS (TEMAZEPAM) at bedtime as needed sleep  #90 x 1   Entered and Authorized by:   Nelwyn Salisbury MD   Signed by:   Nelwyn Salisbury MD on 07/05/2010   Method used:   Print then Give to Patient   RxID:   [id number] BONIVA 150 MG TABS (IBANDRONATE SODIUM) once a month  #3 x 3   Entered and Authorized by:   Nelwyn Salisbury MD   Signed by:   Nelwyn Salisbury MD on 07/05/2010   Method used:   Print then Give to Patient   RxID:   [id number] OMEPRAZOLE 20 MG CPDR (OMEPRAZOLE) 1 by mouth once daily  #90 x 3   Entered and Authorized by:   Nelwyn Salisbury MD   Signed by:   Nelwyn Salisbury MD on 07/05/2010   Method used:   Print then Give to Patient   RxID:   [id number] ZOCOR 20 MG  TABS (SIMVASTATIN) once daily  #90 x 3   Entered and Authorized by:   Nelwyn Salisbury MD   Signed by:   Nelwyn Salisbury MD on 07/05/2010   Method used:   Print then Give to Patient   RxID:   [id number]    Orders Added: 1)  Est. Patient 40-64 years [33295] 2)  EKG w/ Interpretation [93000] 3)  Radiology Referral [Radiology]

## 2010-07-29 NOTE — Miscellaneous (Signed)
Summary: BONE DENSITY  Clinical Lists Changes  Orders: Added new Test order of T-Bone Densitometry (77080) - Signed Added new Test order of T-Lumbar Vertebral Assessment (77082) - Signed 

## 2010-08-04 NOTE — Miscellaneous (Signed)
Summary: Flu Shot/[facility]  Flu Shot/[facility]   Imported By: Maryln Gottron 07/26/2010 15:17:27  _____________________________________________________________________  External Attachment:    Type:   Image     Comment:   External Document

## 2010-08-25 ENCOUNTER — Other Ambulatory Visit: Payer: Self-pay | Admitting: Obstetrics and Gynecology

## 2010-08-25 ENCOUNTER — Other Ambulatory Visit (HOSPITAL_COMMUNITY)
Admission: RE | Admit: 2010-08-25 | Discharge: 2010-08-25 | Disposition: A | Discharge status: Home / Self Care | Payer: BLUE CROSS/BLUE SHIELD | Source: Ambulatory Visit | Attending: Obstetrics and Gynecology | Admitting: Obstetrics and Gynecology

## 2010-08-25 DIAGNOSIS — Z01419 Encounter for gynecological examination (general) (routine) without abnormal findings: Secondary | ICD-10-CM | POA: Insufficient documentation

## 2010-09-28 LAB — DIFFERENTIAL
Basophils Relative: 0 % (ref 0–1)
Eosinophils Absolute: 0 10*3/uL (ref 0.0–0.7)
Neutro Abs: 5.8 10*3/uL (ref 1.7–7.7)
Neutrophils Relative %: 75 % (ref 43–77)

## 2010-09-28 LAB — COMPREHENSIVE METABOLIC PANEL
ALT: 12 U/L (ref 0–35)
Alkaline Phosphatase: 25 U/L — ABNORMAL LOW (ref 39–117)
BUN: 11 mg/dL (ref 6–23)
CO2: 27 mEq/L (ref 19–32)
Calcium: 9.3 mg/dL (ref 8.4–10.5)
GFR calc non Af Amer: >60 mL/min (ref >60)
Glucose, Bld: 91 mg/dL (ref 70–99)
Potassium: 3.9 mEq/L (ref 3.5–5.1)
Sodium: 141 mEq/L (ref 135–145)
Total Protein: 7.3 g/dL (ref 6.0–8.3)

## 2010-09-28 LAB — CBC
HCT: 38.2 % (ref 36.0–46.0)
MCV: 95.3 fL (ref 78.0–100.0)
RBC: 4.01 MIL/uL (ref 3.87–5.11)

## 2010-09-28 LAB — GLUCOSE, CAPILLARY: Glucose-Capillary: 89 mg/dL (ref 70–99)

## 2010-09-28 LAB — TROPONIN I: Troponin I: 0.01 ng/mL (ref 0.00–0.06)

## 2010-10-04 ENCOUNTER — Encounter: Payer: Self-pay | Admitting: Family Medicine

## 2010-10-05 ENCOUNTER — Telehealth: Payer: Self-pay | Admitting: Family Medicine

## 2010-10-05 NOTE — Telephone Encounter (Signed)
Pt has been sch for TSH for 244.9 on 10/06/10 at 11:15am

## 2010-10-05 NOTE — Telephone Encounter (Signed)
Pt is on tsh med . Pt stated she suppose to have repeat labs. What labs?

## 2010-10-05 NOTE — Telephone Encounter (Signed)
Set up a TSH for 244.9

## 2010-10-06 ENCOUNTER — Other Ambulatory Visit (INDEPENDENT_AMBULATORY_CARE_PROVIDER_SITE_OTHER): Payer: BC Managed Care – PPO

## 2010-10-06 DIAGNOSIS — E039 Hypothyroidism, unspecified: Secondary | ICD-10-CM

## 2010-10-14 ENCOUNTER — Telehealth: Payer: Self-pay

## 2010-10-14 NOTE — Telephone Encounter (Signed)
Message copied by Madison Hickman on Thu Oct 14, 2010 11:13 AM ------      Message from: Dwaine Deter      Created: Tue Oct 12, 2010  5:44 AM       Thyroid level is now normal. Stay on current meds, and recheck a TSH in 6 months

## 2010-10-14 NOTE — Telephone Encounter (Signed)
Pt aware.

## 2011-01-05 ENCOUNTER — Other Ambulatory Visit: Payer: Self-pay | Admitting: Obstetrics and Gynecology

## 2011-01-05 DIAGNOSIS — Z1231 Encounter for screening mammogram for malignant neoplasm of breast: Secondary | ICD-10-CM

## 2011-01-13 ENCOUNTER — Ambulatory Visit
Admission: RE | Admit: 2011-01-13 | Discharge: 2011-01-13 | Disposition: A | Discharge status: Home / Self Care | Payer: BLUE CROSS/BLUE SHIELD | Source: Ambulatory Visit | Attending: Obstetrics and Gynecology | Admitting: Obstetrics and Gynecology

## 2011-01-13 DIAGNOSIS — Z1231 Encounter for screening mammogram for malignant neoplasm of breast: Secondary | ICD-10-CM

## 2011-01-28 ENCOUNTER — Ambulatory Visit (INDEPENDENT_AMBULATORY_CARE_PROVIDER_SITE_OTHER): Payer: BLUE CROSS/BLUE SHIELD | Admitting: Family Medicine

## 2011-01-28 ENCOUNTER — Encounter: Payer: Self-pay | Admitting: Family Medicine

## 2011-01-28 VITALS — BP 100/64 | HR 76 | Temp 98.2°F | Wt 121.0 lb

## 2011-01-28 DIAGNOSIS — R300 Dysuria: Secondary | ICD-10-CM

## 2011-01-28 DIAGNOSIS — N390 Urinary tract infection, site not specified: Secondary | ICD-10-CM

## 2011-01-28 LAB — POCT URINALYSIS DIPSTICK
Ketones, UA: NEGATIVE
Protein, UA: NEGATIVE
Spec Grav, UA: 1.02
Urobilinogen, UA: 0.2
pH, UA: 7

## 2011-01-28 MED ORDER — CIPROFLOXACIN HCL 500 MG PO TABS: 500.0000 mg | ORAL_TABLET | Freq: Two times a day (BID) | ORAL | Status: AC

## 2011-01-28 NOTE — Progress Notes (Signed)
  Subjective:    Patient ID: Kristy [name], female    DOB: [birth year], 60 y.o.   MRN: [id number]  HPI Here for 3 days of lower pelvic pressure, urge to urinate, and burning. No nausea or fever. Drinking water and cranberry juice.   Review of Systems  Gastrointestinal: Negative.   Genitourinary: Positive for dysuria, frequency and pelvic pain.       Objective:   Physical Exam  Abdominal: Soft. Bowel sounds are normal. She exhibits no distension and no mass. There is no tenderness. There is no rebound and no guarding.          Assessment & Plan:  Take Cipro for 10 days

## 2011-02-08 ENCOUNTER — Ambulatory Visit (INDEPENDENT_AMBULATORY_CARE_PROVIDER_SITE_OTHER): Payer: BLUE CROSS/BLUE SHIELD | Admitting: Family Medicine

## 2011-02-08 ENCOUNTER — Encounter: Payer: Self-pay | Admitting: Family Medicine

## 2011-02-08 VITALS — BP 110/78 | HR 95 | Temp 99.1°F | Wt 123.0 lb

## 2011-02-08 DIAGNOSIS — R350 Frequency of micturition: Secondary | ICD-10-CM

## 2011-02-08 LAB — POCT URINALYSIS DIPSTICK
Bilirubin, UA: NEGATIVE
Ketones, UA: NEGATIVE
Leukocytes, UA: NEGATIVE
Spec Grav, UA: 1.01
pH, UA: 6

## 2011-02-08 NOTE — Progress Notes (Signed)
  Subjective:    Patient ID: Kristy [name], female    DOB: December 28, 1950, 60 y.o.   MRN: [id number]  HPI Here to recheck a recent UTI. We saw her 11 days ago with frequency and burning, and she finished a course of Cipro yesterday. The burning is gone, but she still has a slight sense of uregncy.   Review of Systems  Constitutional: Negative.   Gastrointestinal: Negative.   Genitourinary: Positive for urgency. Negative for dysuria, frequency, hematuria and flank pain.       Objective:   Physical Exam  Constitutional: She appears well-developed and well-nourished.  Abdominal: Soft. Bowel sounds are normal. She exhibits no distension and no mass. There is no tenderness. There is no rebound and no guarding.          Assessment & Plan:  The UTI appears to be resolved. Drink plenty of water

## 2011-04-25 ENCOUNTER — Telehealth: Payer: Self-pay | Admitting: Family Medicine

## 2011-04-25 DIAGNOSIS — E039 Hypothyroidism, unspecified: Secondary | ICD-10-CM

## 2011-04-25 NOTE — Telephone Encounter (Signed)
Pt was supposed to be in October to check TSH and was calling to get that scheduled but there is no order in computer. Please advise

## 2011-04-25 NOTE — Telephone Encounter (Signed)
Please order a TSH

## 2011-04-26 NOTE — Telephone Encounter (Signed)
Future lab order was put in computer and pt aware.

## 2011-04-27 ENCOUNTER — Other Ambulatory Visit (INDEPENDENT_AMBULATORY_CARE_PROVIDER_SITE_OTHER): Payer: BLUE CROSS/BLUE SHIELD

## 2011-04-27 DIAGNOSIS — E039 Hypothyroidism, unspecified: Secondary | ICD-10-CM

## 2011-04-27 LAB — TSH: TSH: 0.92 u[IU]/mL (ref 0.35–5.50)

## 2011-05-15 ENCOUNTER — Other Ambulatory Visit: Payer: Self-pay | Admitting: Family Medicine

## 2011-06-01 ENCOUNTER — Other Ambulatory Visit: Payer: Self-pay | Admitting: Family Medicine

## 2011-06-24 ENCOUNTER — Other Ambulatory Visit: Payer: Self-pay | Admitting: Family Medicine

## 2011-06-30 ENCOUNTER — Other Ambulatory Visit: Payer: Self-pay | Admitting: Family Medicine

## 2011-06-30 ENCOUNTER — Other Ambulatory Visit (INDEPENDENT_AMBULATORY_CARE_PROVIDER_SITE_OTHER): Payer: BC Managed Care – PPO

## 2011-06-30 DIAGNOSIS — Z0000 Encounter for general adult medical examination without abnormal findings: Secondary | ICD-10-CM

## 2011-06-30 LAB — HEPATIC FUNCTION PANEL
AST: 21 U/L (ref 0–37)
Albumin: 4.1 g/dL (ref 3.5–5.2)
Alkaline Phosphatase: 22 U/L — ABNORMAL LOW (ref 39–117)
Bilirubin, Direct: 0 mg/dL (ref 0.0–0.3)
Total Bilirubin: 0.5 mg/dL (ref 0.3–1.2)

## 2011-06-30 LAB — POCT URINALYSIS DIPSTICK
Leukocytes, UA: NEGATIVE
Nitrite, UA: NEGATIVE
Protein, UA: NEGATIVE
Urobilinogen, UA: 0.2
pH, UA: 7.5

## 2011-06-30 LAB — BASIC METABOLIC PANEL
Calcium: 9.2 mg/dL (ref 8.4–10.5)
GFR: 91.21 mL/min (ref >60.00)
Glucose, Bld: 88 mg/dL (ref 70–99)
Sodium: 140 mEq/L (ref 135–145)

## 2011-06-30 LAB — CBC WITH DIFFERENTIAL/PLATELET
Basophils Absolute: 0 10*3/uL (ref 0.0–0.1)
Hemoglobin: 13.2 g/dL (ref 12.0–15.0)
Lymphocytes Relative: 35.3 % (ref 12.0–46.0)
Monocytes Relative: 8 % (ref 3.0–12.0)
Neutro Abs: 2.9 10*3/uL (ref 1.4–7.7)
RBC: 4.1 Mil/uL (ref 3.87–5.11)
RDW: 13.4 % (ref 11.5–14.6)

## 2011-06-30 LAB — LIPID PANEL
HDL: 56.2 mg/dL (ref >39.00)
LDL Cholesterol: 107 mg/dL — ABNORMAL HIGH (ref 0–99)
Total CHOL/HDL Ratio: 3
VLDL: 19.2 mg/dL (ref 0.0–40.0)

## 2011-07-05 NOTE — Progress Notes (Signed)
Quick Note:  Left voice message ______ 

## 2011-07-11 ENCOUNTER — Encounter: Payer: Self-pay | Admitting: Family Medicine

## 2011-07-11 ENCOUNTER — Ambulatory Visit (INDEPENDENT_AMBULATORY_CARE_PROVIDER_SITE_OTHER): Payer: BC Managed Care – PPO | Admitting: Family Medicine

## 2011-07-11 VITALS — BP 112/68 | HR 82 | Temp 98.3°F | Ht 63.5 in | Wt 120.0 lb

## 2011-07-11 DIAGNOSIS — Z0000 Encounter for general adult medical examination without abnormal findings: Secondary | ICD-10-CM

## 2011-07-11 DIAGNOSIS — Z23 Encounter for immunization: Secondary | ICD-10-CM

## 2011-07-11 MED ORDER — LEVOTHYROXINE SODIUM 50 MCG PO TABS: 50.0000 ug | ORAL_TABLET | Freq: Every day | ORAL | Status: DC

## 2011-07-11 MED ORDER — OMEPRAZOLE 20 MG PO CPDR: 20.0000 mg | DELAYED_RELEASE_CAPSULE | Freq: Every day | ORAL | Status: DC

## 2011-07-11 MED ORDER — IBANDRONATE SODIUM 150 MG PO TABS: 150.0000 mg | ORAL_TABLET | ORAL | Status: DC

## 2011-07-11 MED ORDER — SIMVASTATIN 20 MG PO TABS: 20.0000 mg | ORAL_TABLET | Freq: Every day | ORAL | Status: DC

## 2011-07-11 NOTE — Progress Notes (Signed)
Addended by: Aniceto Boss A on: 07/11/2011 02:41 PM   Modules accepted: Orders

## 2011-07-11 NOTE — Progress Notes (Signed)
  Subjective:    Patient ID: Kristy [name], female    DOB: [birth year], 61 y.o.   MRN: [id number]  HPI 61 yr old female for a cpx. She feels well and has no concerns.   Review of Systems  Constitutional: Negative.   HENT: Negative.   Eyes: Negative.   Respiratory: Negative.   Cardiovascular: Negative.   Gastrointestinal: Negative.   Genitourinary: Negative for dysuria, urgency, frequency, hematuria, flank pain, decreased urine volume, enuresis, difficulty urinating, pelvic pain and dyspareunia.  Musculoskeletal: Negative.   Skin: Negative.   Neurological: Negative.   Hematological: Negative.   Psychiatric/Behavioral: Negative.        Objective:   Physical Exam  Constitutional: She is oriented to person, place, and time. She appears well-developed and well-nourished. No distress.  HENT:  Head: Normocephalic and atraumatic.  Right Ear: External ear normal.  Left Ear: External ear normal.  Nose: Nose normal.  Mouth/Throat: Oropharynx is clear and moist. No oropharyngeal exudate.  Eyes: Conjunctivae and EOM are normal. Pupils are equal, round, and reactive to light. No scleral icterus.  Neck: Normal range of motion. Neck supple. No JVD present. No thyromegaly present.  Cardiovascular: Normal rate, regular rhythm, normal heart sounds and intact distal pulses.  Exam reveals no gallop and no friction rub.   No murmur heard.      EKG normal   Pulmonary/Chest: Effort normal and breath sounds normal. No respiratory distress. She has no wheezes. She has no rales. She exhibits no tenderness.  Abdominal: Soft. Bowel sounds are normal. She exhibits no distension and no mass. There is no tenderness. There is no rebound and no guarding.  Musculoskeletal: Normal range of motion. She exhibits no edema and no tenderness.  Lymphadenopathy:    She has no cervical adenopathy.  Neurological: She is alert and oriented to person, place, and time. She has normal reflexes. No cranial nerve deficit. She  exhibits normal muscle tone. Coordination normal.  Skin: Skin is warm and dry. No rash noted. No erythema.  Psychiatric: She has a normal mood and affect. Her behavior is normal. Judgment and thought content normal.          Assessment & Plan:  Well exam. Given a DTaP. She is past due for a colonoscopy so she will contact Dr. Christella Hartigan' office for this.

## 2011-07-25 ENCOUNTER — Encounter: Payer: Self-pay | Admitting: Gastroenterology

## 2011-08-12 ENCOUNTER — Ambulatory Visit (AMBULATORY_SURGERY_CENTER): Payer: BC Managed Care – PPO | Admitting: *Deleted

## 2011-08-12 VITALS — Ht 63.5 in | Wt 120.5 lb

## 2011-08-12 DIAGNOSIS — Z1211 Encounter for screening for malignant neoplasm of colon: Secondary | ICD-10-CM

## 2011-08-12 DIAGNOSIS — Z800 Family history of malignant neoplasm of digestive organs: Secondary | ICD-10-CM

## 2011-08-12 MED ORDER — PEG-KCL-NACL-NASULF-NA ASC-C 100 G PO SOLR: ORAL | Status: DC

## 2011-08-26 ENCOUNTER — Ambulatory Visit (AMBULATORY_SURGERY_CENTER): Payer: BC Managed Care – PPO | Admitting: Gastroenterology

## 2011-08-26 ENCOUNTER — Encounter: Payer: Self-pay | Admitting: Gastroenterology

## 2011-08-26 DIAGNOSIS — K635 Polyp of colon: Secondary | ICD-10-CM

## 2011-08-26 DIAGNOSIS — Z800 Family history of malignant neoplasm of digestive organs: Secondary | ICD-10-CM

## 2011-08-26 DIAGNOSIS — D126 Benign neoplasm of colon, unspecified: Secondary | ICD-10-CM

## 2011-08-26 DIAGNOSIS — Z1211 Encounter for screening for malignant neoplasm of colon: Secondary | ICD-10-CM

## 2011-08-26 MED ORDER — SODIUM CHLORIDE 0.9 % IV SOLN: 500.0000 mL | INTRAVENOUS | Status: DC

## 2011-08-26 NOTE — Progress Notes (Signed)
Patient did not experience any of the following events: a burn prior to discharge; a fall within the facility; wrong site/side/patient/procedure/implant event; or a hospital transfer or hospital admission upon discharge from the facility. (G8907) Patient did not have preoperative order for IV antibiotic SSI prophylaxis. (G8918)  

## 2011-08-26 NOTE — Op Note (Signed)
[facility] [address] [place], [place]  [address]  COLONOSCOPY PROCEDURE REPORT  PATIENT:  Kristy [name], Kristy [name]  MR#:  [id number] BIRTHDATE:  30-Dec-1950, 60 yrs. old  GENDER:  female ENDOSCOPIST:  Rachael Fee, MD PROCEDURE DATE:  08/26/2011 PROCEDURE:  Colonoscopy with snare polypectomy ASA CLASS:  Class II INDICATIONS:  Elevated Risk Screening, father had colon cancer MEDICATIONS:   Fentanyl 50 mcg IV, These medications were titrated to patient response per physician's verbal order, Versed 4 mg IV  DESCRIPTION OF PROCEDURE:   After the risks benefits and alternatives of the procedure were thoroughly explained, informed consent was obtained.  Digital rectal exam was performed and revealed no rectal masses.   The LB CF-[id number] endoscope was introduced through the anus and advanced to the cecum, which was identified by both the appendix and ileocecal valve, without limitations.  The quality of the prep was good..  The instrument was then slowly withdrawn as the colon was fully examined. <<PROCEDUREIMAGES>> FINDINGS:  A diminutive polyp was found in the descending colon. This was removed with cold snare and sent to pathology (jar 1) (see image3 and image4).  Mild diverticulosis was found in the sigmoid to descending colon segments (see image1).  This was otherwise a normal examination of the colon (see image5 and image2).   Retroflexed views in the rectum revealed no abnormalities. COMPLICATIONS:  None  ENDOSCOPIC IMPRESSION: 1) Diminutive polyp in the descending colon, this was removed and sent to pathology 2) Mild diverticulosis in the sigmoid to descending colon segments 3) Otherwise normal examination  RECOMMENDATIONS: 1) Given your significant family history of colon cancer, you should have a repeat colonoscopy in 5 years even if the polyp removed today is NOT precancerous. 2) You will receive a letter within 1-2 weeks with the results of your biopsy  as well as final recommendations. Please call my office if you have not received a letter after 3 weeks.  ______________________________ Rachael Fee, MD  n. eSIGNED:   Rachael Fee at 08/26/2011 10:54 AM  Florian Buff, [phone]

## 2011-08-26 NOTE — Patient Instructions (Signed)

## 2011-08-29 ENCOUNTER — Telehealth: Payer: Self-pay | Admitting: *Deleted

## 2011-08-29 NOTE — Telephone Encounter (Signed)
  Follow up Call-  Call back number 08/26/2011  Post procedure Call Back phone  # [phone]  Permission to leave phone message Yes     Patient questions:  Do you have a fever, pain , or abdominal swelling? no Pain Score  0 *  Have you tolerated food without any problems? yes  Have you been able to return to your normal activities? yes  Do you have any questions about your discharge instructions: Diet   no Medications  no Follow up visit  no  Do you have questions or concerns about your Care? no  Actions: * If pain score is 4 or above: No action needed, pain <4.

## 2011-08-29 NOTE — Telephone Encounter (Signed)
No answer left message to call back if concerns.

## 2011-08-30 ENCOUNTER — Other Ambulatory Visit: Payer: Self-pay | Admitting: Obstetrics and Gynecology

## 2011-08-30 ENCOUNTER — Other Ambulatory Visit (HOSPITAL_COMMUNITY)
Admission: RE | Admit: 2011-08-30 | Discharge: 2011-08-30 | Disposition: A | Discharge status: Home / Self Care | Payer: BC Managed Care – PPO | Source: Ambulatory Visit | Attending: Obstetrics and Gynecology | Admitting: Obstetrics and Gynecology

## 2011-08-30 DIAGNOSIS — Z01419 Encounter for gynecological examination (general) (routine) without abnormal findings: Secondary | ICD-10-CM | POA: Insufficient documentation

## 2011-09-01 ENCOUNTER — Encounter: Payer: Self-pay | Admitting: Gastroenterology

## 2011-09-30 ENCOUNTER — Telehealth: Payer: Self-pay | Admitting: Family Medicine

## 2011-09-30 NOTE — Telephone Encounter (Signed)
Pt request that scripts for Prilosec, Simvastatin, & Boniva must read dispense generic and resend to [facility].

## 2011-10-05 NOTE — Telephone Encounter (Signed)
I faxed all 3 scripts to [facility].

## 2011-10-17 ENCOUNTER — Telehealth: Payer: Self-pay | Admitting: Family Medicine

## 2011-10-17 MED ORDER — IBANDRONATE SODIUM 150 MG PO TABS: 150.0000 mg | ORAL_TABLET | ORAL | Status: DC

## 2011-10-17 MED ORDER — OMEPRAZOLE 20 MG PO CPDR: 20.0000 mg | DELAYED_RELEASE_CAPSULE | Freq: Every day | ORAL | Status: DC

## 2011-10-17 MED ORDER — SIMVASTATIN 20 MG PO TABS: 20.0000 mg | ORAL_TABLET | Freq: Every day | ORAL | Status: DC

## 2011-10-17 NOTE — Telephone Encounter (Signed)
Pt request that scripts for Prilosec, Simvastatin, & Boniva must read dispense generic and resend to [facility].  

## 2011-10-17 NOTE — Telephone Encounter (Signed)
All 3 scripts were sent in again to [facility].

## 2011-10-21 ENCOUNTER — Telehealth: Payer: Self-pay | Admitting: Family Medicine

## 2011-10-21 MED ORDER — IBANDRONATE SODIUM 150 MG PO TABS: 150.0000 mg | ORAL_TABLET | ORAL | Status: DC

## 2011-10-21 NOTE — Telephone Encounter (Signed)
Pt has contacted [facility] again and was told that the ibandronate (BONIVA) 150 MG tablet has not bent rcvd by pharmacy yet.  Also that the  omeprazole (PRILOSEC) 20 MG capsule requires Prior Authorization. Pt said that [facility] was sending over a Prior Authorization Form. Pts mem ID for [facility] is # [id number].  Their phone # [phone] option 2 to call in script, option 3 for faxing script.

## 2011-10-21 NOTE — Telephone Encounter (Signed)
I spoke with pt and the only script that she needs now is the [place] and generic only. I put a new order in computer for this and also faxed to [facility].

## 2011-10-28 ENCOUNTER — Telehealth: Payer: Self-pay | Admitting: *Deleted

## 2011-10-28 NOTE — Telephone Encounter (Signed)
Pharmacy has reference questions re: this pts meds.  Please call.

## 2011-10-28 NOTE — Telephone Encounter (Signed)
See Incoming Telephone at top of phone note, please.

## 2011-10-28 NOTE — Telephone Encounter (Signed)
Which pharmacy?

## 2011-10-31 MED ORDER — IBANDRONATE SODIUM 150 MG PO TABS: ORAL_TABLET | ORAL | Status: DC

## 2011-10-31 NOTE — Telephone Encounter (Signed)
[facility] and changed the order to read ( 60 minutes ) and I did change in computer.

## 2011-12-27 ENCOUNTER — Other Ambulatory Visit: Payer: Self-pay | Admitting: Obstetrics and Gynecology

## 2011-12-27 DIAGNOSIS — Z1231 Encounter for screening mammogram for malignant neoplasm of breast: Secondary | ICD-10-CM

## 2012-01-25 ENCOUNTER — Ambulatory Visit
Admission: RE | Admit: 2012-01-25 | Discharge: 2012-01-25 | Disposition: A | Discharge status: Home / Self Care | Payer: BC Managed Care – PPO | Source: Ambulatory Visit | Attending: Obstetrics and Gynecology | Admitting: Obstetrics and Gynecology

## 2012-01-25 DIAGNOSIS — Z1231 Encounter for screening mammogram for malignant neoplasm of breast: Secondary | ICD-10-CM

## 2012-06-11 ENCOUNTER — Other Ambulatory Visit: Payer: Self-pay | Admitting: Family Medicine

## 2012-07-02 ENCOUNTER — Telehealth: Payer: Self-pay | Admitting: Family Medicine

## 2012-07-02 NOTE — Telephone Encounter (Signed)
Per Dr. Fry, okay to schedule for 07/03/12, can you call pt to schedule this? 

## 2012-07-02 NOTE — Telephone Encounter (Signed)
Pt has fluttering, ripple feel in chest. No pain. Rhythm pattern feels "different". Has been going on 7 + days. Would like to come in tomorrow or the next day. Only same day appts. Pls advise.

## 2012-07-02 NOTE — Telephone Encounter (Signed)
Pt aware/kjh 

## 2012-07-03 ENCOUNTER — Encounter: Payer: Self-pay | Admitting: Family Medicine

## 2012-07-03 ENCOUNTER — Ambulatory Visit (INDEPENDENT_AMBULATORY_CARE_PROVIDER_SITE_OTHER): Payer: BC Managed Care – PPO | Admitting: Family Medicine

## 2012-07-03 VITALS — BP 160/88 | HR 106 | Temp 98.1°F | Wt 114.0 lb

## 2012-07-03 DIAGNOSIS — R002 Palpitations: Secondary | ICD-10-CM

## 2012-07-03 NOTE — Progress Notes (Signed)
  Subjective:    Patient ID: Kristy [name], female    DOB: 04/15/1951, 62 y.o.   MRN: [id number]  HPI Here for intermittent episodes of fluttering in the chest for the past 7 days. She has never felt this before. They started during a recent beach vacation and has persisted since then. These are not related to exertion or eating. She describes a "quivering" sensation which is sometimes accompanied by a mild pressure in the chest for a few seconds. These spells only last about 2 or 3 seconds at a time. They occur multiple times a day. No chest pain or SOB or nausea or sweats. She normally has one cup of caffeinated coffee each morning but she has stopped this.    Review of Systems  Constitutional: Negative.   Respiratory: Negative.   Cardiovascular: Negative for chest pain and leg swelling.  Gastrointestinal: Negative.        Objective:   Physical Exam  Constitutional: She appears well-developed and well-nourished. No distress.  Neck: No thyromegaly present.  Cardiovascular: Normal rate, regular rhythm, normal heart sounds and intact distal pulses.  Exam reveals no gallop and no friction rub.   No murmur heard.      EKG normal   Pulmonary/Chest: Effort normal and breath sounds normal. No respiratory distress. She has no wheezes. She has no rales.  Lymphadenopathy:    She has no cervical adenopathy.          Assessment & Plan:  Palpitations of uncertain etiology. She is already set up for complete labs next week since she is seeing [place] for a cpx in 2 weeks, so we will wait to get labs until that day. Set up a 48 hour Holter monitor.

## 2012-07-05 ENCOUNTER — Telehealth: Payer: Self-pay | Admitting: Family Medicine

## 2012-07-05 NOTE — Telephone Encounter (Signed)
Pt said to let you told her to let you know if she had any pain. She has experienced some mid back pain, in the afternoon/ evening times only. It is a pain that comes and goes. She has not hurt her back. She thought this may be associated w/ the heart palpitations. The back pain is not going on at the same time as the extra beats of heart . This is the third day with the back pain.

## 2012-07-06 NOTE — Telephone Encounter (Signed)
I spoke with pt  

## 2012-07-06 NOTE — Telephone Encounter (Signed)
It sounds like the two are not related. Much sure to take the Prilosec every day. We will see her for the cpx soon

## 2012-07-11 ENCOUNTER — Other Ambulatory Visit (INDEPENDENT_AMBULATORY_CARE_PROVIDER_SITE_OTHER): Payer: BC Managed Care – PPO

## 2012-07-11 DIAGNOSIS — Z0000 Encounter for general adult medical examination without abnormal findings: Secondary | ICD-10-CM

## 2012-07-11 LAB — LIPID PANEL
Cholesterol: 156 mg/dL (ref 0–200)
LDL Cholesterol: 81 mg/dL (ref 0–99)
Total CHOL/HDL Ratio: 3
Triglycerides: 100 mg/dL (ref 0.0–149.0)
VLDL: 20 mg/dL (ref 0.0–40.0)

## 2012-07-11 LAB — BASIC METABOLIC PANEL
BUN: 12 mg/dL (ref 6–23)
Chloride: 103 mEq/L (ref 96–112)
GFR: 87.98 mL/min (ref >60.00)
Potassium: 4 mEq/L (ref 3.5–5.1)
Sodium: 138 mEq/L (ref 135–145)

## 2012-07-11 LAB — HEPATIC FUNCTION PANEL
ALT: 14 U/L (ref 0–35)
AST: 19 U/L (ref 0–37)
Albumin: 4.3 g/dL (ref 3.5–5.2)
Total Protein: 7.3 g/dL (ref 6.0–8.3)

## 2012-07-11 LAB — CBC WITH DIFFERENTIAL/PLATELET
Basophils Relative: 0.4 % (ref 0.0–3.0)
Eosinophils Relative: 2.3 % (ref 0.0–5.0)
HCT: 40.7 % (ref 36.0–46.0)
Lymphs Abs: 1.9 10*3/uL (ref 0.7–4.0)
Monocytes Relative: 8.4 % (ref 3.0–12.0)
Neutrophils Relative %: 60 % (ref 43.0–77.0)
Platelets: 277 10*3/uL (ref 150.0–400.0)
RBC: 4.32 Mil/uL (ref 3.87–5.11)
WBC: 6.5 10*3/uL (ref 4.5–10.5)

## 2012-07-11 LAB — POCT URINALYSIS DIPSTICK
Protein, UA: NEGATIVE
Spec Grav, UA: 1.015
Urobilinogen, UA: 0.2

## 2012-07-12 NOTE — Progress Notes (Signed)
Quick Note:  Pt has CPE on 07/18/12 will go over then. ______

## 2012-07-13 ENCOUNTER — Ambulatory Visit (INDEPENDENT_AMBULATORY_CARE_PROVIDER_SITE_OTHER): Payer: BC Managed Care – PPO

## 2012-07-13 DIAGNOSIS — R002 Palpitations: Secondary | ICD-10-CM

## 2012-07-13 NOTE — Progress Notes (Signed)
Placed a 48  hr holter on patient and went over instructions on how to use it and when to return it

## 2012-07-18 ENCOUNTER — Ambulatory Visit (INDEPENDENT_AMBULATORY_CARE_PROVIDER_SITE_OTHER): Payer: BC Managed Care – PPO | Admitting: Family Medicine

## 2012-07-18 ENCOUNTER — Encounter: Payer: Self-pay | Admitting: Family Medicine

## 2012-07-18 VITALS — BP 138/76 | HR 90 | Temp 98.4°F | Ht 64.0 in | Wt 112.0 lb

## 2012-07-18 DIAGNOSIS — M899 Disorder of bone, unspecified: Secondary | ICD-10-CM

## 2012-07-18 DIAGNOSIS — Z0000 Encounter for general adult medical examination without abnormal findings: Secondary | ICD-10-CM

## 2012-07-18 DIAGNOSIS — M8580 Other specified disorders of bone density and structure, unspecified site: Secondary | ICD-10-CM

## 2012-07-18 NOTE — Progress Notes (Signed)
  Subjective:    Patient ID: Kristy [name], female    DOB: 03-09-1951, 62 y.o.   MRN: [id number]  HPI 62 yr old female for a cpx. She feels fine except for occasional palpitations. She had worn a Holter monitor for a few days and just turned it back in, so we are awaiting the report on this.    Review of Systems  Constitutional: Negative.   HENT: Negative.   Eyes: Negative.   Respiratory: Negative.   Cardiovascular: Negative.   Gastrointestinal: Negative.   Genitourinary: Negative for dysuria, urgency, frequency, hematuria, flank pain, decreased urine volume, enuresis, difficulty urinating, pelvic pain and dyspareunia.  Musculoskeletal: Negative.   Skin: Negative.   Neurological: Negative.   Hematological: Negative.   Psychiatric/Behavioral: Negative.        Objective:   Physical Exam  Constitutional: She is oriented to person, place, and time. She appears well-developed and well-nourished. No distress.  HENT:  Head: Normocephalic and atraumatic.  Right Ear: External ear normal.  Left Ear: External ear normal.  Nose: Nose normal.  Mouth/Throat: Oropharynx is clear and moist. No oropharyngeal exudate.  Eyes: Conjunctivae normal and EOM are normal. Pupils are equal, round, and reactive to light. No scleral icterus.  Neck: Normal range of motion. Neck supple. No JVD present. No thyromegaly present.  Cardiovascular: Normal rate, regular rhythm, normal heart sounds and intact distal pulses.  Exam reveals no gallop and no friction rub.   No murmur heard. Pulmonary/Chest: Effort normal and breath sounds normal. No respiratory distress. She has no wheezes. She has no rales. She exhibits no tenderness.  Abdominal: Soft. Bowel sounds are normal. She exhibits no distension and no mass. There is no tenderness. There is no rebound and no guarding.  Musculoskeletal: Normal range of motion. She exhibits no edema and no tenderness.  Lymphadenopathy:    She has no cervical adenopathy.    Neurological: She is alert and oriented to person, place, and time. She has normal reflexes. No cranial nerve deficit. She exhibits normal muscle tone. Coordination normal.  Skin: Skin is warm and dry. No rash noted. No erythema.  Psychiatric: She has a normal mood and affect. Her behavior is normal. Judgment and thought content normal.          Assessment & Plan:  Well exam. Set up another DEXA

## 2012-07-19 ENCOUNTER — Ambulatory Visit (INDEPENDENT_AMBULATORY_CARE_PROVIDER_SITE_OTHER)
Admission: RE | Admit: 2012-07-19 | Discharge: 2012-07-19 | Disposition: A | Discharge status: Home / Self Care | Payer: BC Managed Care – PPO | Source: Ambulatory Visit

## 2012-07-19 DIAGNOSIS — M8580 Other specified disorders of bone density and structure, unspecified site: Secondary | ICD-10-CM

## 2012-07-19 DIAGNOSIS — M899 Disorder of bone, unspecified: Secondary | ICD-10-CM

## 2012-07-19 DIAGNOSIS — M949 Disorder of cartilage, unspecified: Secondary | ICD-10-CM

## 2012-07-24 NOTE — Progress Notes (Signed)
Quick Note:  Pt wants results of the heart monitor test? ______

## 2012-07-24 NOTE — Progress Notes (Signed)
Quick Note:  I spoke with pt ______ 

## 2012-07-25 NOTE — Progress Notes (Signed)
Quick Note:  I spoke with pt ______ 

## 2012-08-26 ENCOUNTER — Other Ambulatory Visit: Payer: Self-pay | Admitting: Family Medicine

## 2012-08-29 ENCOUNTER — Other Ambulatory Visit: Payer: Self-pay | Admitting: Obstetrics and Gynecology

## 2012-08-29 ENCOUNTER — Other Ambulatory Visit: Payer: Self-pay | Admitting: Family Medicine

## 2012-08-29 ENCOUNTER — Other Ambulatory Visit (HOSPITAL_COMMUNITY)
Admission: RE | Admit: 2012-08-29 | Discharge: 2012-08-29 | Disposition: A | Discharge status: Home / Self Care | Payer: BC Managed Care – PPO | Source: Ambulatory Visit | Attending: Obstetrics and Gynecology | Admitting: Obstetrics and Gynecology

## 2012-08-29 DIAGNOSIS — Z1151 Encounter for screening for human papillomavirus (HPV): Secondary | ICD-10-CM | POA: Insufficient documentation

## 2012-08-29 DIAGNOSIS — Z01419 Encounter for gynecological examination (general) (routine) without abnormal findings: Secondary | ICD-10-CM | POA: Insufficient documentation

## 2012-08-29 NOTE — Telephone Encounter (Signed)
Refill for one year 

## 2012-10-29 ENCOUNTER — Telehealth: Payer: Self-pay | Admitting: Family Medicine

## 2012-10-29 MED ORDER — OMEPRAZOLE 20 MG PO CPDR: 20.0000 mg | DELAYED_RELEASE_CAPSULE | Freq: Every day | ORAL | Status: DC

## 2012-10-29 NOTE — Telephone Encounter (Signed)
Pt requesting new rx/refills of her omeprazole (PRILOSEC) 20 MG capsule be sent to [facility] mail order. This rx requries Prior auth - I am working on that. Thanks~!

## 2012-10-29 NOTE — Telephone Encounter (Signed)
I sent script e-scribe. 

## 2013-01-21 ENCOUNTER — Other Ambulatory Visit: Payer: Self-pay

## 2013-01-21 DIAGNOSIS — Z1231 Encounter for screening mammogram for malignant neoplasm of breast: Secondary | ICD-10-CM

## 2013-01-21 DIAGNOSIS — Z853 Personal history of malignant neoplasm of breast: Secondary | ICD-10-CM

## 2013-01-21 DIAGNOSIS — Z9889 Other specified postprocedural states: Secondary | ICD-10-CM

## 2013-02-13 ENCOUNTER — Ambulatory Visit
Admission: RE | Admit: 2013-02-13 | Discharge: 2013-02-13 | Disposition: A | Discharge status: Home / Self Care | Payer: BC Managed Care – PPO | Source: Ambulatory Visit

## 2013-02-13 DIAGNOSIS — Z1231 Encounter for screening mammogram for malignant neoplasm of breast: Secondary | ICD-10-CM

## 2013-02-13 DIAGNOSIS — Z853 Personal history of malignant neoplasm of breast: Secondary | ICD-10-CM

## 2013-02-13 DIAGNOSIS — Z9889 Other specified postprocedural states: Secondary | ICD-10-CM

## 2013-03-06 ENCOUNTER — Telehealth: Payer: Self-pay | Admitting: Family Medicine

## 2013-03-06 ENCOUNTER — Ambulatory Visit (INDEPENDENT_AMBULATORY_CARE_PROVIDER_SITE_OTHER): Payer: BC Managed Care – PPO | Admitting: Family Medicine

## 2013-03-06 ENCOUNTER — Encounter: Payer: Self-pay | Admitting: Family Medicine

## 2013-03-06 VITALS — BP 120/74 | HR 88 | Temp 98.8°F | Wt 116.0 lb

## 2013-03-06 DIAGNOSIS — I498 Other specified cardiac arrhythmias: Secondary | ICD-10-CM

## 2013-03-06 DIAGNOSIS — R002 Palpitations: Secondary | ICD-10-CM

## 2013-03-06 DIAGNOSIS — I4902 Ventricular flutter: Secondary | ICD-10-CM

## 2013-03-06 DIAGNOSIS — Z23 Encounter for immunization: Secondary | ICD-10-CM

## 2013-03-06 MED ORDER — METOPROLOL SUCCINATE ER 25 MG PO TB24: 25.0000 mg | ORAL_TABLET | Freq: Every day | ORAL | Status: DC

## 2013-03-06 NOTE — Progress Notes (Signed)
  Subjective:    Patient ID: Kristy [name], female    DOB: 1950/10/28, 62 y.o.   MRN: [id number]  HPI Here for palpitations. She had these frequently last January and February, and we worked these up with labs and a Holter monitor. This showed rare PACs and rare PVCs with a single 6 beat run of NSVT. These episodes stopped in February and she has done well with them since then until she was awakened with a spell at 4 am this morning. She felt her heart racing and fluttering off an on for about 2 hours, and then it stopped. She has felt fine ever since. No chest pain or SOB. Her meds are the same as always. No unusual foods or caffeine use recently. She often has a hot chocolate in the afternoons, but this doe snot bother her. No recent antihistamines or decongestants.    Review of Systems  Constitutional: Negative.   Respiratory: Negative.   Cardiovascular: Positive for palpitations. Negative for chest pain and leg swelling.  Neurological: Negative.        Objective:   Physical Exam  Constitutional: She appears well-developed and well-nourished. No distress.  Neck: No thyromegaly present.  Cardiovascular: Normal rate, regular rhythm, normal heart sounds and intact distal pulses.   EKG normal   Pulmonary/Chest: Effort normal and breath sounds normal.  Lymphadenopathy:    She has no cervical adenopathy.          Assessment & Plan:  These bouts of ectopy appear to be benign. We agreed to get on a prophylactic medication, so she will start Metoprolol succinate 25 mg daily. Recheck in 2 weeks.

## 2013-03-06 NOTE — Addendum Note (Signed)
Addended by: Aniceto Boss A on: 03/06/2013 01:04 PM   Modules accepted: Orders

## 2013-03-06 NOTE — Telephone Encounter (Signed)
Pt is here now in the office for a visit.

## 2013-03-06 NOTE — Telephone Encounter (Signed)
Patient Information:  Caller Name: [name]  Phone: [phone]  Patient: Kristy [name]  Gender: Female  DOB: 1950-11-14  Age: 62 Years  PCP: Kristy [name] [facility])  Office Follow Up:  Does the office need to follow up with this patient?: No  Instructions For The Office: N/A   Symptoms  Reason For Call & Symptoms: Calling about waking up at 0400 feeling hot and felt heart fluttering with every breath. She tried changing positions and didn't help.Episodes in January and wore heart monitor and MD not concerned. She cut out caffeine and sx resolved by mid Febuary. Today feeling sx if she sits still- not noticing it as much if up moving around. She is feeling nervous and shaky. Pulse in neck felt and heart not racing but feeling a flutter > 4x per one min.  Reviewed Health History In EMR: Yes  Reviewed Medications In EMR: Yes  Reviewed Allergies In EMR: Yes  Reviewed Surgeries / Procedures: Yes  Date of Onset of Symptoms: 03/06/2013  Treatments Tried: Changed positions  Treatments Tried Worked: No  Guideline(s) Used:  Heart Rate and Heartbeat Questions  Disposition Per Guideline:   See Today in Office  Reason For Disposition Reached:   Skipped or extra beat(s) and occurs 4 or more times per minute  Advice Given:  Reassurance  Everybody has palpitations at some point in their lives. Sometimes it is simply a heightened awareness of the heart's normal beating.  Occasional extra heart beats are experienced by most everyone. Lack of sleep, stress, and caffeinated beverages can make palpitations worse.  Patients with anxiety or stress may describe a "rapid heartbeat" or "pounding" in their chest from their heart beating.  Avoid Caffeine  Avoid caffeine-containing beverages (Reason: caffeine is a stimulant and can aggravate palpitations).  Examples of caffeine-containing beverages include coffee, tea, colas, [address], [employer], and some energy drinks.  Expected Course  : If  your symptoms do not improve over the next couple days then you should make an appointment to see your doctor.  Health Basics  Sleep: Try to get sufficient amount of sleep. Lack of sleep can aggravate palpitations. Most people need 7-8 hours of sleep each night.  Diet: Eat a balanced healthy diet.  Liquid Intake: Drink adequate liquids, 6-8 glasses of water daily.  Avoid Caffeine  Avoid caffeine-containing beverages (Reason: caffeine is a stimulant and can aggravate palpitations).  Examples of caffeine-containing beverages include coffee, tea, colas, [address], [employer], and some energy drinks.  Call Back If:  Chest pain, lightheadedness, or difficulty breathing occurs  Heart beating more than 130 beats / minute  More than 3 extra or skipped beats / minute  You become worse.  Expected Course  : If your symptoms do not improve over the next couple days then you should make an appointment to see your doctor.  Patient Will Follow Care Advice:  YES  Appointment Scheduled:  03/06/2013 10:30:00 Appointment Scheduled Provider:  Gershon [name] [facility])

## 2013-03-08 ENCOUNTER — Encounter: Payer: Self-pay | Admitting: Family Medicine

## 2013-03-08 ENCOUNTER — Ambulatory Visit (INDEPENDENT_AMBULATORY_CARE_PROVIDER_SITE_OTHER): Payer: BC Managed Care – PPO | Admitting: Family Medicine

## 2013-03-08 VITALS — BP 136/70 | HR 85 | Temp 98.6°F | Wt 115.0 lb

## 2013-03-08 DIAGNOSIS — R002 Palpitations: Secondary | ICD-10-CM

## 2013-03-08 NOTE — Progress Notes (Signed)
  Subjective:    Patient ID: Kristy [name], female    DOB: September 13, 1950, 62 y.o.   MRN: [id number]  HPI Here to follow up on palpitations. She was here 2 days ago for this, and we started her on low dose Metoprolol. She has continued to notice frequent spells of palpitations, and she became more concerned about them. She knows that anxiety can contribute to these, and she admits that sitting quietly and praying seems to make them go away. She still denies any sx with these spells. As we speak today she does not notice any palpitations at all.    Review of Systems  Constitutional: Negative.   Respiratory: Negative.   Cardiovascular: Positive for palpitations. Negative for chest pain and leg swelling.       Objective:   Physical Exam  Constitutional: She appears well-developed and well-nourished. No distress.  Cardiovascular: Normal rate, regular rhythm, normal heart sounds and intact distal pulses.   I listened to her chest for 3 straight minutes and heard no ectopy at all   Pulmonary/Chest: Effort normal and breath sounds normal.          Assessment & Plan:  I reassured her that the palpitations seem benign, and that it was too early to expect any benefit from the Metoprolol. Hopefully this will be kicking in by next week to some degree. We spoke again about how anxiety makes this problem worse, and she will try to relax as much as she can. Follow up as scheduled.

## 2013-03-18 ENCOUNTER — Other Ambulatory Visit: Payer: Self-pay | Admitting: Family Medicine

## 2013-03-22 ENCOUNTER — Ambulatory Visit (INDEPENDENT_AMBULATORY_CARE_PROVIDER_SITE_OTHER): Payer: BC Managed Care – PPO | Admitting: Family Medicine

## 2013-03-22 ENCOUNTER — Encounter: Payer: Self-pay | Admitting: Family Medicine

## 2013-03-22 VITALS — BP 120/66 | HR 97 | Temp 98.1°F | Wt 118.0 lb

## 2013-03-22 DIAGNOSIS — R002 Palpitations: Secondary | ICD-10-CM

## 2013-03-22 NOTE — Progress Notes (Signed)
  Subjective:    Patient ID: Kristy [name], female    DOB: February 17, 1951, 62 y.o.   MRN: [id number]  HPI Here to follow up on palpitations. 2 weeks ago we started her on low dose Metoprolol and this has worked quite well for her. The palpitations slowed down to the point that they have completely stopped for the past few days. She feels great.    Review of Systems  Constitutional: Negative.   Respiratory: Negative.   Cardiovascular: Negative.        Objective:   Physical Exam  Constitutional: She appears well-developed and well-nourished.  Cardiovascular: Normal rate, regular rhythm, normal heart sounds and intact distal pulses.   Pulmonary/Chest: Effort normal and breath sounds normal.          Assessment & Plan:  She is doing well. We will stay on the current regimen. Recheck prn

## 2013-04-03 ENCOUNTER — Telehealth: Payer: Self-pay | Admitting: Family Medicine

## 2013-04-03 ENCOUNTER — Ambulatory Visit (INDEPENDENT_AMBULATORY_CARE_PROVIDER_SITE_OTHER): Payer: BC Managed Care – PPO | Admitting: Family Medicine

## 2013-04-03 ENCOUNTER — Encounter: Payer: Self-pay | Admitting: Family Medicine

## 2013-04-03 VITALS — BP 112/66 | HR 74 | Temp 98.4°F | Wt 116.0 lb

## 2013-04-03 DIAGNOSIS — R002 Palpitations: Secondary | ICD-10-CM

## 2013-04-03 MED ORDER — METOPROLOL SUCCINATE ER 50 MG PO TB24: 50.0000 mg | ORAL_TABLET | Freq: Every day | ORAL | Status: DC

## 2013-04-03 NOTE — Progress Notes (Signed)
  Subjective:    Patient ID: Kristy [name], female    DOB: 1950/12/28, 62 y.o.   MRN: [id number]  HPI Here with some more palpitations in the past few days. She feels fine otherwise. She has been on Metoprolol 25 mg daily.    Review of Systems  Constitutional: Negative.   Respiratory: Negative.   Cardiovascular: Positive for palpitations. Negative for chest pain and leg swelling.       Objective:   Physical Exam  Constitutional: She appears well-developed and well-nourished. No distress.  Cardiovascular: Normal rate, regular rhythm, normal heart sounds and intact distal pulses.   Pulmonary/Chest: Effort normal and breath sounds normal.          Assessment & Plan:  We will increase the metoprolol to 50 mg daily. Recheck prn

## 2013-04-03 NOTE — Telephone Encounter (Signed)
Patient Information:  Caller Name: [place]  Phone: [phone]  Patient: Kristy [name], Kristy [name]  Gender: Female  DOB: 05-24-1951  Age: 62 Years  PCP: Gershon Crane [facility])  Office Follow Up:  Does the office need to follow up with this patient?: Yes  Instructions For The Office: MD call back within 1 hour regarding episode of chest pain > 5 minutes after office visit that resolved.  RN Note:  Also noted heart "thumping" this morning while still in bed for 1.5 hours.  MD increased Metoprolol dose from 25 mg to 50 mg. Patient took the 2nd dose (25 mg) when got home from office, after chest pain resolved. Educated that when has severe chest pain or chest pain > 5 minutes or difficulty breathing to call 911.  Since just seen this AM, advised will have MD speak directly with patient.    Symptoms  Reason For Call & Symptoms: Substernal chest pain after 04/03/13 office visit approximately 1240.  Seen for > frequency of palpitation since 03/06/13.  . Chest pain lasted between 5-10 minutes, was rated 4-5/10 and increased when took deep breaths. Pain completely resovled now.  Reviewed Health History In EMR: Yes  Reviewed Medications In EMR: Yes  Reviewed Allergies In EMR: Yes  Reviewed Surgeries / Procedures: Yes  Date of Onset of Symptoms: 04/03/2013  Treatments Tried: Deep breaths  Treatments Tried Worked: No  Guideline(s) Used:  Chest Pain  Disposition Per Guideline:   See Today in Office  Reason For Disposition Reached:   All other patients with chest pain  Advice Given:  N/A  RN Overrode Recommendation:  Follow Up With Office Later  Request MD call patient since just had AM office visit

## 2013-04-03 NOTE — Telephone Encounter (Signed)
I spoke with pt and it does not sound heart related. Pt only had this problem once today. I did give this information to Dr. Clent Ridges. Pt will follow up as needed.

## 2013-04-03 NOTE — Telephone Encounter (Signed)
Please call her and get some more information about this pain. By the CAN description it does not sound like cardiac pain

## 2013-05-10 ENCOUNTER — Ambulatory Visit (INDEPENDENT_AMBULATORY_CARE_PROVIDER_SITE_OTHER): Payer: BC Managed Care – PPO | Admitting: Family Medicine

## 2013-05-10 ENCOUNTER — Encounter: Payer: Self-pay | Admitting: Family Medicine

## 2013-05-10 VITALS — BP 130/74 | HR 70 | Temp 98.3°F | Wt 116.0 lb

## 2013-05-10 DIAGNOSIS — S2329XA Dislocation of other parts of thorax, initial encounter: Secondary | ICD-10-CM

## 2013-05-10 DIAGNOSIS — S2341XA Sprain of ribs, initial encounter: Secondary | ICD-10-CM

## 2013-05-10 NOTE — Progress Notes (Signed)
  Subjective:    Patient ID: Kristy [name], female    DOB: [birth year], 62 y.o.   MRN: [id number]  HPI Here for one week of pain in the right ribs after her husband rolled over in his sleep on top of her. She felt a "pop" that night and it has been sore since then. No SOB.    Review of Systems  Constitutional: Negative.   Respiratory: Negative.   Cardiovascular: Negative for palpitations and leg swelling.       Objective:   Physical Exam  Constitutional: She appears well-developed and well-nourished.  Cardiovascular: Normal rate, regular rhythm, normal heart sounds and intact distal pulses.   Pulmonary/Chest: Effort normal and breath sounds normal. No respiratory distress. She has no wheezes. She has no rales.  Tender over the right lateral ribs, no swelling or crepitus          Assessment & Plan:  Costochondral separation. Rest, heat, Advil prn

## 2013-05-10 NOTE — Progress Notes (Signed)
Pre visit review using our clinic review tool, if applicable. No additional management support is needed unless otherwise documented below in the visit note. 

## 2013-05-20 ENCOUNTER — Ambulatory Visit (INDEPENDENT_AMBULATORY_CARE_PROVIDER_SITE_OTHER): Payer: BC Managed Care – PPO | Admitting: Family Medicine

## 2013-05-20 ENCOUNTER — Encounter: Payer: Self-pay | Admitting: Family Medicine

## 2013-05-20 VITALS — BP 132/66 | HR 70 | Temp 98.3°F | Wt 116.0 lb

## 2013-05-20 DIAGNOSIS — R002 Palpitations: Secondary | ICD-10-CM

## 2013-05-20 NOTE — Progress Notes (Signed)
Pre visit review using our clinic review tool, if applicable. No additional management support is needed unless otherwise documented below in the visit note. 

## 2013-05-20 NOTE — Progress Notes (Signed)
  Subjective:    Patient ID: Kristy [name], female    DOB: 07/10/1950, 62 y.o.   MRN: [id number]  HPI Here for worsening palpitations. She has had these off and on for almost a year now. They are usually brief and not bothersome, but they are becoming more frequent. She had 2 such episodes this past weekend, each lasting about an hour. These are described as pounding in the chest rather than racing or fluttering. No chest pain or SOB.    Review of Systems  Constitutional: Negative.   Respiratory: Negative.   Cardiovascular: Positive for palpitations. Negative for chest pain and leg swelling.       Objective:   Physical Exam  Constitutional: She appears well-developed and well-nourished. No distress.  Cardiovascular: Normal rate, regular rhythm, normal heart sounds and intact distal pulses.   Pulmonary/Chest: Effort normal and breath sounds normal.          Assessment & Plan:  Refer to Cardiology to further evaluate

## 2013-06-03 ENCOUNTER — Ambulatory Visit (INDEPENDENT_AMBULATORY_CARE_PROVIDER_SITE_OTHER): Payer: BC Managed Care – PPO | Admitting: Cardiology

## 2013-06-03 ENCOUNTER — Encounter: Payer: Self-pay | Admitting: Cardiology

## 2013-06-03 VITALS — BP 130/68 | HR 71 | Ht 64.0 in | Wt 118.0 lb

## 2013-06-03 DIAGNOSIS — R002 Palpitations: Secondary | ICD-10-CM

## 2013-06-03 DIAGNOSIS — R0609 Other forms of dyspnea: Secondary | ICD-10-CM

## 2013-06-03 DIAGNOSIS — J309 Allergic rhinitis, unspecified: Secondary | ICD-10-CM

## 2013-06-03 DIAGNOSIS — R0600 Dyspnea, unspecified: Secondary | ICD-10-CM

## 2013-06-03 DIAGNOSIS — I472 Ventricular tachycardia: Secondary | ICD-10-CM

## 2013-06-03 NOTE — Progress Notes (Signed)
[address] [place], [place]  [address] Phone: [phone] Fax:  [phone]  Date:  06/03/2013   ID:  Kristy [name], DOB 1951-03-31, MRN [id number]  PCP:  Nelwyn Salisbury, MD   History of Present Illness: Kristy [name] is a 62 y.o. female here for the evaluation of palpitations. Back in 1/14 had few palpitations at the beach. Like a "flutter". Over a few months they lessened. In September, came back. Started metoprolol. Then came back again, a little bit. Went up to 50mg  of metoprolol. No longer flutters but having thumping like a baby kicking. Maybe cold or lifting previously effected. Seemed to happen more with lifting. Off caffeine. They are increasing in frequency and are sometimes lasting longer durations. At times, they can last one hour in duration. They feel like a pounding. They do not seem to be prolonged racing episodes. Denies any syncope, angina, chest pain, shortness of breath. However, she does state that when walking up [place], she can feel shortness of breath which has been going on for quite some time.  She has had experience of vasovagal syncope during blood draws or procedures. She's currently taking medication for hyperlipidemia. 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 She has a history of left breast lumpectomy, intraductal.  Father had 3 MI's in his 28's. [facility]. ALS.       Wt Readings from Last 3 Encounters:  06/03/13 118 lb (53.524 kg)  05/20/13 116 lb (52.617 kg)  05/10/13 116 lb (52.617 kg)     Past Medical History  Diagnosis Date  . Cancer 1998    breast  . Blood in stool   . Osteopenia     last DEXA 07-19-10  . GERD (gastroesophageal reflux disease)   . Allergy   . Hyperlipidemia   . Hypothyroidism   . Gynecologic exam normal     sees Dr. Gerald Leitz  . Posterior vitreous detachment of left eye     sees Dr. Champ Mungo     Past Surgical History  Procedure Laterality Date  .  Tonsillectomy  1959  . Breast lumpectomy  1998    left  . Colonoscopy  08-26-11    per Dr. Christella Hartigan, polyps, repeat in 5 yrs     Current Outpatient Prescriptions  Medication Sig Dispense Refill  . calcium-vitamin D (OSCAL WITH D) 500-200 MG-UNIT per tablet Take 1 tablet by mouth daily.        . Cholecalciferol (VITAMIN D-3) 1000 UNITS CAPS Take 1,000 Units by mouth daily.      . fish oil-omega-3 fatty acids 1000 MG capsule Take 1 g by mouth daily.        Marland Kitchen ibandronate (BONIVA) 150 MG tablet TAKE 1 TAB EVERY 30 DAYS IN  MORNING WITH FULL GLASS WATER ON EMPTY STOMACH, DO NOT TAKE ANYTHING ELSE OR LIE DOWN FOR NEXT 60 MINS  3 tablet  4  . levothyroxine (SYNTHROID, LEVOTHROID) 50 MCG tablet TAKE 1 TABLET DAILY  90 tablet  0  . loratadine (CLARITIN) 10 MG tablet Take 10 mg by mouth daily.        . metoprolol succinate (TOPROL-XL) 50 MG 24 hr tablet Take 1 tablet (50 mg total) by mouth daily. Take with or immediately following a meal.  30 tablet  2  . Multiple Vitamin (MULTIVITAMIN) tablet Take 1 tablet by mouth daily.        Marland Kitchen omeprazole (PRILOSEC) 20 MG  capsule Take 1 capsule (20 mg total) by mouth daily.  90 capsule  3  . simvastatin (ZOCOR) 20 MG tablet TAKE 1 TABLET (20 MG TOTAL) AT BEDTIME  90 tablet  3   No current facility-administered medications for this visit.    Allergies:    Allergies  Allergen Reactions  . Levofloxacin     REACTION: neuralgia    Social History:  The patient  reports that she has never smoked. She has never used smokeless tobacco. She reports that she does not drink alcohol or use illicit drugs.   Family History  Problem Relation Age of Onset  . ALS      fhx  . Arthritis      fhx  . Hyperlipidemia      fhx  . Hypertension      fhx  . Mental illness      fhx  . Heart disease      fhx  . Colon cancer Mother 71  . Stomach cancer Neg Hx   . Colon cancer Paternal Aunt 74  . Heart disease Father   . Heart attack Father   . Hypertension Mother   .  Hypertension Father   . Cancer Mother     ROS:  Please see the history of present illness.   Denies any strokelike symptoms, bleeding, orthopnea, PND, rashes, fever, prior thyroid abnormalities.   All other systems reviewed and negative.   PHYSICAL EXAM: VS:  BP 130/68  Pulse 71  Ht 5\' 4"  (1.626 m)  Wt 118 lb (53.524 kg)  BMI 20.24 kg/m2 Well nourished, well developed, in no acute distress HEENT: normal, [place]/AT, EOMI Neck: no JVD, normal carotid upstroke, no bruit Cardiac:  normal S1, S2; RRR; no murmur Lungs:  clear to auscultation bilaterally, no wheezing, rhonchi or rales Abd: soft, nontender, no hepatomegaly, no bruits Ext: no edema, 2+ distal pulses Skin: warm and dry GU: deferred Neuro: no focal abnormalities noted, AAO x 3  TSH 2.78 LDL 81 LFTs normal Hemoglobin 13.5  07/13/12-48 hour Holter monitor-occasional PACs, 6 beats NSVT at 4 AM, asymptomatic  EKG:  Normal rhythm, 71, no other abnormalities, normal QT  ASSESSMENT AND PLAN:  1. Palpitations - intermittent, likely secondary to PACs/PVCs as seen on prior Holter monitor. However, she did have a 6 beat run of nonsustained ventricular tachycardia, heart rate of 158, at 4 AM, asymptomatic. Since the Holter monitor, she was placed on beta blocker. I would like to have an echocardiogram performed, and sure proper structure and function especially with NSVT. She certainly has not had any syncopal episodes. Interestingly however, she does seem to have her palpitations occur mostly with activity. Usually, PVCs or PACs manifest themselves at rest. Agree with reduction in caffeine. I'm fine with her eating and occasional chocolate. Thyroid reassuring. Electronically reassuring. 2. Dyspnea-checking echocardiogram, ETT. 3. Nonsustained ventricular tachycardia-brief 6 beat run seen on prior Holter monitor. I will check an exercise treadmill test to ensure that she is not manifesting any signs of ischemia especially with her father's  family history of first myocardial infarction being at age 52. I agree with beta blocker. If structure and function of her heart are normal, that is normal ejection fraction, continue with beta blocker. 4. I will follow up with her regarding her testing. If symptoms worsen or become a worrisome, she can contact me.  Signed, Donato Schultz, MD [facility]  06/03/2013 2:00 PM

## 2013-06-03 NOTE — Patient Instructions (Signed)
Your physician recommends that you continue on your current medications as directed. Please refer to the Current Medication list given to you today.  Your physician has requested that you have an exercise tolerance test. For further information please visit [email or web address] Please also follow instruction sheet, as given.  Your physician has requested that you have an echocardiogram. Echocardiography is a painless test that uses sound waves to create images of your heart. It provides your doctor with information about the size and shape of your heart and how well your heart's chambers and valves are working. This procedure takes approximately one hour. There are no restrictions for this procedure.  Follow-up as needed,

## 2013-06-11 ENCOUNTER — Ambulatory Visit (HOSPITAL_COMMUNITY)
Admission: RE | Admit: 2013-06-11 | Discharge: 2013-06-11 | Disposition: A | Discharge status: Home / Self Care | Payer: BC Managed Care – PPO | Source: Ambulatory Visit | Attending: Cardiology | Admitting: Cardiology

## 2013-06-11 DIAGNOSIS — R0609 Other forms of dyspnea: Secondary | ICD-10-CM | POA: Insufficient documentation

## 2013-06-11 DIAGNOSIS — R069 Unspecified abnormalities of breathing: Secondary | ICD-10-CM

## 2013-06-11 DIAGNOSIS — R0989 Other specified symptoms and signs involving the circulatory and respiratory systems: Secondary | ICD-10-CM | POA: Insufficient documentation

## 2013-06-11 DIAGNOSIS — R002 Palpitations: Secondary | ICD-10-CM

## 2013-06-16 ENCOUNTER — Other Ambulatory Visit: Payer: Self-pay | Admitting: Family Medicine

## 2013-06-19 ENCOUNTER — Ambulatory Visit (HOSPITAL_COMMUNITY): Payer: BC Managed Care – PPO | Attending: Cardiology | Admitting: Cardiology

## 2013-06-19 ENCOUNTER — Encounter: Payer: Self-pay | Admitting: Cardiology

## 2013-06-19 DIAGNOSIS — I472 Ventricular tachycardia: Secondary | ICD-10-CM

## 2013-06-19 DIAGNOSIS — R0609 Other forms of dyspnea: Secondary | ICD-10-CM | POA: Insufficient documentation

## 2013-06-19 DIAGNOSIS — R0989 Other specified symptoms and signs involving the circulatory and respiratory systems: Secondary | ICD-10-CM | POA: Insufficient documentation

## 2013-06-19 DIAGNOSIS — R002 Palpitations: Secondary | ICD-10-CM

## 2013-06-19 DIAGNOSIS — E785 Hyperlipidemia, unspecified: Secondary | ICD-10-CM | POA: Insufficient documentation

## 2013-06-19 DIAGNOSIS — R0600 Dyspnea, unspecified: Secondary | ICD-10-CM

## 2013-06-19 NOTE — Progress Notes (Signed)
Echo performed. 

## 2013-07-12 ENCOUNTER — Other Ambulatory Visit (INDEPENDENT_AMBULATORY_CARE_PROVIDER_SITE_OTHER): Payer: BC Managed Care – PPO

## 2013-07-12 DIAGNOSIS — Z0000 Encounter for general adult medical examination without abnormal findings: Secondary | ICD-10-CM

## 2013-07-12 LAB — CBC WITH DIFFERENTIAL/PLATELET
Basophils Absolute: 0 K/uL (ref 0.0–0.1)
Basophils Relative: 0.3 % (ref 0.0–3.0)
Eosinophils Absolute: 0.2 K/uL (ref 0.0–0.7)
Eosinophils Relative: 2.4 % (ref 0.0–5.0)
HCT: 39.7 % (ref 36.0–46.0)
Hemoglobin: 13.4 g/dL (ref 12.0–15.0)
Lymphocytes Relative: 14.6 % (ref 12.0–46.0)
Lymphs Abs: 1.1 K/uL (ref 0.7–4.0)
MCHC: 33.7 g/dL (ref 30.0–36.0)
MCV: 94.2 fl (ref 78.0–100.0)
Monocytes Absolute: 0.6 K/uL (ref 0.1–1.0)
Monocytes Relative: 7.1 % (ref 3.0–12.0)
Neutro Abs: 5.9 K/uL (ref 1.4–7.7)
Neutrophils Relative %: 75.6 % (ref 43.0–77.0)
Platelets: 222 K/uL (ref 150.0–400.0)
RBC: 4.22 Mil/uL (ref 3.87–5.11)
RDW: 13.1 % (ref 11.5–14.6)
WBC: 7.8 K/uL (ref 4.5–10.5)

## 2013-07-12 LAB — BASIC METABOLIC PANEL
BUN: 15 mg/dL (ref 6–23)
CHLORIDE: 103 meq/L (ref 96–112)
CO2: 26 meq/L (ref 19–32)
CREATININE: 0.7 mg/dL (ref 0.4–1.2)
Calcium: 9.2 mg/dL (ref 8.4–10.5)
GFR: 86.29 mL/min (ref >60.00)
GLUCOSE: 92 mg/dL (ref 70–99)
POTASSIUM: 3.7 meq/L (ref 3.5–5.1)
Sodium: 136 mEq/L (ref 135–145)

## 2013-07-12 LAB — HEPATIC FUNCTION PANEL
ALT: 20 U/L (ref 0–35)
AST: 22 U/L (ref 0–37)
Albumin: 4.1 g/dL (ref 3.5–5.2)
Alkaline Phosphatase: 25 U/L — ABNORMAL LOW (ref 39–117)
BILIRUBIN DIRECT: 0 mg/dL (ref 0.0–0.3)
BILIRUBIN TOTAL: 0.7 mg/dL (ref 0.3–1.2)
Total Protein: 7 g/dL (ref 6.0–8.3)

## 2013-07-12 LAB — POCT URINALYSIS DIPSTICK
Bilirubin, UA: NEGATIVE
Glucose, UA: NEGATIVE
Ketones, UA: NEGATIVE
Leukocytes, UA: NEGATIVE
Nitrite, UA: NEGATIVE
Protein, UA: NEGATIVE
Spec Grav, UA: 1.01
Urobilinogen, UA: 0.2
pH, UA: 7

## 2013-07-12 LAB — LIPID PANEL
CHOL/HDL RATIO: 3
CHOLESTEROL: 158 mg/dL (ref 0–200)
HDL: 56.6 mg/dL (ref >39.00)
LDL CALC: 86 mg/dL (ref 0–99)
TRIGLYCERIDES: 76 mg/dL (ref 0.0–149.0)
VLDL: 15.2 mg/dL (ref 0.0–40.0)

## 2013-07-12 LAB — TSH: TSH: 1.69 u[IU]/mL (ref 0.35–5.50)

## 2013-07-17 ENCOUNTER — Telehealth: Payer: Self-pay | Admitting: Family Medicine

## 2013-07-17 MED ORDER — METOPROLOL SUCCINATE ER 50 MG PO TB24: 50.0000 mg | ORAL_TABLET | Freq: Every day | ORAL | Status: DC

## 2013-07-17 NOTE — Telephone Encounter (Signed)
Pt had called pharm last week for refill of metoprolol succinate (TOPROL-XL) 50 MG 24 hr tablet Now pt is out. Can you refill 30 days prior to her appt on 1/23? [facility] [address]

## 2013-07-17 NOTE — Telephone Encounter (Signed)
I sent script e-scribe and spoke with pt. 

## 2013-07-18 ENCOUNTER — Encounter: Payer: BC Managed Care – PPO | Admitting: Family Medicine

## 2013-07-19 ENCOUNTER — Ambulatory Visit (INDEPENDENT_AMBULATORY_CARE_PROVIDER_SITE_OTHER): Payer: BC Managed Care – PPO | Admitting: Family Medicine

## 2013-07-19 ENCOUNTER — Encounter: Payer: Self-pay | Admitting: Family Medicine

## 2013-07-19 VITALS — BP 124/64 | HR 75 | Temp 98.5°F | Ht 64.0 in | Wt 119.0 lb

## 2013-07-19 DIAGNOSIS — Z2911 Encounter for prophylactic immunotherapy for respiratory syncytial virus (RSV): Secondary | ICD-10-CM

## 2013-07-19 DIAGNOSIS — Z23 Encounter for immunization: Secondary | ICD-10-CM

## 2013-07-19 DIAGNOSIS — Z0000 Encounter for general adult medical examination without abnormal findings: Secondary | ICD-10-CM

## 2013-07-19 MED ORDER — METOPROLOL SUCCINATE ER 50 MG PO TB24: 50.0000 mg | ORAL_TABLET | Freq: Every day | ORAL | Status: DC

## 2013-07-19 NOTE — Progress Notes (Signed)
Pre visit review using our clinic review tool, if applicable. No additional management support is needed unless otherwise documented below in the visit note. 

## 2013-07-19 NOTE — Addendum Note (Signed)
Addended by: Aggie Hacker A on: 07/19/2013 11:37 AM   Modules accepted: Orders

## 2013-07-19 NOTE — Progress Notes (Signed)
   Subjective:    Patient ID: Kristy [name], female    DOB: Nov 29, 1950, 64 y.o.   MRN: [id number]  HPI 63 yr old female for a cpx. She feels well and she has learned to live with her occasional palpitations. Her workup revealed some PACs and PVCs with occasional short runs of NSVT. These are well controlled with metoprolol.    Review of Systems  Constitutional: Negative.   HENT: Negative.   Eyes: Negative.   Respiratory: Negative.   Cardiovascular: Negative.   Gastrointestinal: Negative.   Genitourinary: Negative for dysuria, urgency, frequency, hematuria, flank pain, decreased urine volume, enuresis, difficulty urinating, pelvic pain and dyspareunia.  Musculoskeletal: Negative.   Skin: Negative.   Neurological: Negative.   Psychiatric/Behavioral: Negative.        Objective:   Physical Exam  Constitutional: She is oriented to person, place, and time. She appears well-developed and well-nourished. No distress.  HENT:  Head: Normocephalic and atraumatic.  Right Ear: External ear normal.  Left Ear: External ear normal.  Nose: Nose normal.  Mouth/Throat: Oropharynx is clear and moist. No oropharyngeal exudate.  Eyes: Conjunctivae and EOM are normal. Pupils are equal, round, and reactive to light. No scleral icterus.  Neck: Normal range of motion. Neck supple. No JVD present. No thyromegaly present.  Cardiovascular: Normal rate, regular rhythm, normal heart sounds and intact distal pulses.  Exam reveals no gallop and no friction rub.   No murmur heard. Pulmonary/Chest: Effort normal and breath sounds normal. No respiratory distress. She has no wheezes. She has no rales. She exhibits no tenderness.  Abdominal: Soft. Bowel sounds are normal. She exhibits no distension and no mass. There is no tenderness. There is no rebound and no guarding.  Musculoskeletal: Normal range of motion. She exhibits no edema and no tenderness.  Lymphadenopathy:    She has no cervical adenopathy.    Neurological: She is alert and oriented to person, place, and time. She has normal reflexes. No cranial nerve deficit. She exhibits normal muscle tone. Coordination normal.  Skin: Skin is warm and dry. No rash noted. No erythema.  Psychiatric: She has a normal mood and affect. Her behavior is normal. Judgment and thought content normal.          Assessment & Plan:  Well exam.

## 2013-08-24 ENCOUNTER — Other Ambulatory Visit: Payer: Self-pay | Admitting: Family Medicine

## 2013-09-02 ENCOUNTER — Other Ambulatory Visit: Payer: Self-pay | Admitting: Obstetrics and Gynecology

## 2013-09-02 ENCOUNTER — Other Ambulatory Visit (HOSPITAL_COMMUNITY)
Admission: RE | Admit: 2013-09-02 | Discharge: 2013-09-02 | Disposition: A | Discharge status: Home / Self Care | Payer: BC Managed Care – PPO | Source: Ambulatory Visit | Attending: Obstetrics and Gynecology | Admitting: Obstetrics and Gynecology

## 2013-09-02 DIAGNOSIS — Z01419 Encounter for gynecological examination (general) (routine) without abnormal findings: Secondary | ICD-10-CM | POA: Insufficient documentation

## 2013-09-03 ENCOUNTER — Ambulatory Visit (INDEPENDENT_AMBULATORY_CARE_PROVIDER_SITE_OTHER): Payer: BC Managed Care – PPO | Admitting: Family Medicine

## 2013-09-03 ENCOUNTER — Encounter: Payer: Self-pay | Admitting: Family Medicine

## 2013-09-03 VITALS — BP 110/70 | HR 83 | Temp 97.7°F | Wt 120.0 lb

## 2013-09-03 DIAGNOSIS — J329 Chronic sinusitis, unspecified: Secondary | ICD-10-CM

## 2013-09-03 MED ORDER — AMOXICILLIN 875 MG PO TABS: 875.0000 mg | ORAL_TABLET | Freq: Two times a day (BID) | ORAL | Status: DC

## 2013-09-03 NOTE — Progress Notes (Signed)
Chief Complaint  Patient presents with  . Sinusitis    HPI:  URI: -started: 3 weeks ago -symptoms:nasal congestion, sore throat, cough, sinus pain -denies:fever, SOB, NVD, tooth pain -has tried: usine -sick contacts/travel/risks: denies flu exposure or Ebola risks -Hx of: sinusitis from time to time - has been three years since last sinus infection  ROS: See pertinent positives and negatives per HPI.  Past Medical History  Diagnosis Date  . Cancer 1998    breast  . Blood in stool   . Osteoporosis     last DEXA 07-18-12  . GERD (gastroesophageal reflux disease)   . Allergy   . Hyperlipidemia   . Hypothyroidism   . Gynecologic exam normal     sees Dr. Christophe Louis  . Posterior vitreous detachment of left eye     sees Dr. Starling Manns     Past Surgical History  Procedure Laterality Date  . Tonsillectomy  1959  . Breast lumpectomy  1998    left  . Colonoscopy  08-26-11    per Dr. Ardis Hughs, polyps, repeat in 5 yrs     Family History  Problem Relation Age of Onset  . ALS      fhx  . Arthritis      fhx  . Hyperlipidemia      fhx  . Hypertension      fhx  . Mental illness      fhx  . Heart disease      fhx  . Colon cancer Mother 29  . Stomach cancer Neg Hx   . Colon cancer Paternal Aunt 78  . Heart disease Father   . Heart attack Father   . Hypertension Mother   . Hypertension Father   . Cancer Mother     History   Social History  . Marital Status: Married    Spouse Name: N/A    Number of Children: N/A  . Years of Education: N/A   Social History Main Topics  . Smoking status: Never Smoker   . Smokeless tobacco: Never Used  . Alcohol Use: No  . Drug Use: No  . Sexual Activity: None   Other Topics Concern  . None   Social History Narrative  . None    Current outpatient prescriptions:calcium-vitamin D (OSCAL WITH D) 500-200 MG-UNIT per tablet, Take 1 tablet by mouth daily.  , Disp: , Rfl: ;  Cholecalciferol (VITAMIN D-3) 1000 UNITS CAPS, Take 1,000  Units by mouth daily., Disp: , Rfl: ;  fish oil-omega-3 fatty acids 1000 MG capsule, Take 1 g by mouth daily.  , Disp: , Rfl:  ibandronate (BONIVA) 150 MG tablet, TAKE 1 TAB EVERY 30 DAYS IN  MORNING WITH FULL GLASS WATER ON EMPTY STOMACH, DO NOT TAKE ANYTHING ELSE OR LIE DOWN FOR NEXT 60 MINS, Disp: 3 tablet, Rfl: 4;  levothyroxine (SYNTHROID, LEVOTHROID) 50 MCG tablet, TAKE 1 TABLET DAILY, Disp: 90 tablet, Rfl: 3;  loratadine (CLARITIN) 10 MG tablet, Take 10 mg by mouth daily.  , Disp: , Rfl:  metoprolol succinate (TOPROL-XL) 50 MG 24 hr tablet, Take 1 tablet (50 mg total) by mouth daily. Take with or immediately following a meal., Disp: 90 tablet, Rfl: 3;  Multiple Vitamin (MULTIVITAMIN) tablet, Take 1 tablet by mouth daily.  , Disp: , Rfl: ;  omeprazole (PRILOSEC) 20 MG capsule, Take 1 capsule (20 mg total) by mouth daily., Disp: 90 capsule, Rfl: 3 simvastatin (ZOCOR) 20 MG tablet, TAKE 1 TABLET (20 MG TOTAL) AT BEDTIME, Disp:  90 tablet, Rfl: 3;  amoxicillin (AMOXIL) 875 MG tablet, Take 1 tablet (875 mg total) by mouth 2 (two) times daily., Disp: 20 tablet, Rfl: 0  EXAM:  Filed Vitals:   09/03/13 1406  BP: 110/70  Pulse: 83  Temp: 97.7 F (36.5 C)    Body mass index is 20.59 kg/(m^2).  GENERAL: vitals reviewed and listed above, alert, oriented, appears well hydrated and in no acute distress  HEENT: atraumatic, conjunttiva clear, no obvious abnormalities on inspection of external nose and ears, normal appearance of ear canals and TMs, clear nasal congestion, mild post oropharyngeal erythema with PND, no tonsillar edema or exudate, no sinus TTP  NECK: no obvious masses on inspection  LUNGS: clear to auscultation bilaterally, no wheezes, rales or rhonchi, good air movement  CV: HRRR, no peripheral edema  MS: moves all extremities without noticeable abnormality  PSYCH: pleasant and cooperative, no obvious depression or anxiety  ASSESSMENT AND PLAN:  Discussed the following assessment  and plan:  Sinusitis - Plan: amoxicillin (AMOXIL) 875 MG tablet  -given length of symptoms and sinus pain feel abx reasonable in case bacterial rhinosinusitis - risks discussed -return precautions discussed -of course, we advised to return or notify a doctor immediately if symptoms worsen or persist or new concerns arise.    Patient Instructions  INSTRUCTIONS FOR UPPER RESPIRATORY INFECTION:  -plenty of rest and fluids  -As we discussed, we have prescribed a new medication for you at this appointment. We discussed the common and serious potential adverse effects of this medication and you can review these and more with the pharmacist when you pick up your medication.  Please follow the instructions for use carefully and notify us immediately if you have any problems taking this medication.  -nasal saline wash 2-3 times daily (use prepackaged nasal saline or bottled/distilled water if making your own)   -can use sinex or afrin nasal spray for drainage and nasal congestion - but do NOT use longer then 3-4 days  -can use tylenol or ibuprofen as directed for aches and sorethroat  -in the winter time, using a humidifier at night is helpful (please follow cleaning instructions)  -if you are taking a cough medication - use only as directed, may also try a teaspoon of honey to coat the throat and throat lozenges  -for sore throat, salt water gargles can help  -follow up if you have fevers, facial pain, tooth pain, difficulty breathing or are worsening or not getting better in 5-7 days      Aymen Widrig R.

## 2013-09-03 NOTE — Progress Notes (Signed)
Pre visit review using our clinic review tool, if applicable. No additional management support is needed unless otherwise documented below in the visit note. 

## 2013-09-03 NOTE — Patient Instructions (Signed)
INSTRUCTIONS FOR UPPER RESPIRATORY INFECTION:  -plenty of rest and fluids  -As we discussed, we have prescribed a new medication for you at this appointment. We discussed the common and serious potential adverse effects of this medication and you can review these and more with the pharmacist when you pick up your medication.  Please follow the instructions for use carefully and notify us immediately if you have any problems taking this medication.  -nasal saline wash 2-3 times daily (use prepackaged nasal saline or bottled/distilled water if making your own)   -can use sinex or afrin nasal spray for drainage and nasal congestion - but do NOT use longer then 3-4 days  -can use tylenol or ibuprofen as directed for aches and sorethroat  -in the winter time, using a humidifier at night is helpful (please follow cleaning instructions)  -if you are taking a cough medication - use only as directed, may also try a teaspoon of honey to coat the throat and throat lozenges  -for sore throat, salt water gargles can help  -follow up if you have fevers, facial pain, tooth pain, difficulty breathing or are worsening or not getting better in 5-7 days  

## 2013-09-10 ENCOUNTER — Ambulatory Visit (INDEPENDENT_AMBULATORY_CARE_PROVIDER_SITE_OTHER): Payer: BC Managed Care – PPO | Admitting: Family Medicine

## 2013-09-10 ENCOUNTER — Encounter: Payer: Self-pay | Admitting: Family Medicine

## 2013-09-10 VITALS — BP 128/70 | HR 77 | Temp 98.9°F | Ht 64.0 in | Wt 118.0 lb

## 2013-09-10 DIAGNOSIS — J019 Acute sinusitis, unspecified: Secondary | ICD-10-CM

## 2013-09-10 MED ORDER — AMOXICILLIN-POT CLAVULANATE 875-125 MG PO TABS: 1.0000 | ORAL_TABLET | Freq: Two times a day (BID) | ORAL | Status: DC

## 2013-09-10 NOTE — Progress Notes (Signed)
Pre visit review using our clinic review tool, if applicable. No additional management support is needed unless otherwise documented below in the visit note. 

## 2013-09-10 NOTE — Progress Notes (Signed)
   Subjective:    Patient ID: Kristy [name], female    DOB: [birth year], 64 y.o.   MRN: [id number]  HPI Here for continued sinus problems. She has been sick for 4 weeks with sinus pressure, PND, ST, and a dry cough. One week ago she was given Amoxicillin and this helped, but only a little.    Review of Systems  Constitutional: Negative.   HENT: Positive for congestion and postnasal drip.   Eyes: Negative.   Respiratory: Positive for cough.        Objective:   Physical Exam  Constitutional: She appears well-developed and well-nourished.  HENT:  Right Ear: External ear normal.  Left Ear: External ear normal.  Nose: Nose normal.  Mouth/Throat: Oropharynx is clear and moist.  Eyes: Conjunctivae are normal.  Pulmonary/Chest: Effort normal and breath sounds normal.  Lymphadenopathy:    She has no cervical adenopathy.          Assessment & Plan:  Partially treated sinusitis. Use Augmentin and add Mucinex

## 2013-09-14 ENCOUNTER — Other Ambulatory Visit: Payer: Self-pay | Admitting: Family Medicine

## 2013-09-30 ENCOUNTER — Ambulatory Visit (INDEPENDENT_AMBULATORY_CARE_PROVIDER_SITE_OTHER): Payer: BC Managed Care – PPO | Admitting: Family

## 2013-09-30 ENCOUNTER — Encounter: Payer: Self-pay | Admitting: Family

## 2013-09-30 VITALS — BP 120/60 | HR 62 | Temp 98.1°F | Wt 120.0 lb

## 2013-09-30 DIAGNOSIS — J309 Allergic rhinitis, unspecified: Secondary | ICD-10-CM

## 2013-09-30 DIAGNOSIS — J329 Chronic sinusitis, unspecified: Secondary | ICD-10-CM

## 2013-09-30 DIAGNOSIS — B9789 Other viral agents as the cause of diseases classified elsewhere: Secondary | ICD-10-CM

## 2013-09-30 MED ORDER — FLUTICASONE PROPIONATE 50 MCG/ACT NA SUSP
2.0000 | Freq: Every day | NASAL | Status: DC
Start: 2013-09-30 — End: 2014-07-23

## 2013-09-30 NOTE — Progress Notes (Signed)
Subjective:    Patient ID: Kristy [name], female    DOB: 1950/11/25, 63 y.o.   MRN: [id number]  HPI 63 y.o. White female presents today presents today with chief complaint of "sinus infection". Pt has been seen twice in the past month for sinus infection and put on Amoxicillin and then Augmentin. Pt states that after finishing Augmentin on March 28th, she felt much improved but was still congested. Starting on Friday, pt states she began to feel more congested, stopped having nasal drainage and began having increased post nasal drip. She has been taking Mucinex to help with secretion and congestion but is not getting relief. She denies tenderness to sinuses, denies throat pain, denies SOB, denies fever, chills and fatigue.     Review of Systems  Constitutional: Negative.   HENT: Positive for congestion, postnasal drip and sinus pressure.   Eyes: Negative.   Respiratory: Negative.   Cardiovascular: Negative.   Gastrointestinal: Negative.   Endocrine: Negative.   Genitourinary: Negative.   Musculoskeletal: Negative.   Skin: Negative.   Allergic/Immunologic: Negative.   Neurological: Negative.   Hematological: Negative.   Psychiatric/Behavioral: Negative.    Past Medical History  Diagnosis Date  . Cancer 1998    breast  . Blood in stool   . Osteoporosis     last DEXA 07-18-12  . GERD (gastroesophageal reflux disease)   . Allergy   . Hyperlipidemia   . Hypothyroidism   . Gynecologic exam normal     sees Dr. Christophe Louis  . Posterior vitreous detachment of left eye     sees Dr. Starling Manns     History   Social History  . Marital Status: Married    Spouse Name: N/A    Number of Children: N/A  . Years of Education: N/A   Occupational History  . Not on file.   Social History Main Topics  . Smoking status: Never Smoker   . Smokeless tobacco: Never Used  . Alcohol Use: No  . Drug Use: No  . Sexual Activity: Not on file   Other Topics Concern  . Not on file   Social History  Narrative  . No narrative on file    Past Surgical History  Procedure Laterality Date  . Tonsillectomy  1959  . Breast lumpectomy  1998    left  . Colonoscopy  08-26-11    per Dr. Ardis Hughs, polyps, repeat in 5 yrs     Family History  Problem Relation Age of Onset  . ALS      fhx  . Arthritis      fhx  . Hyperlipidemia      fhx  . Hypertension      fhx  . Mental illness      fhx  . Heart disease      fhx  . Colon cancer Mother 42  . Stomach cancer Neg Hx   . Colon cancer Paternal Aunt 78  . Heart disease Father   . Heart attack Father   . Hypertension Mother   . Hypertension Father   . Cancer Mother     Allergies  Allergen Reactions  . Levofloxacin     REACTION: neuralgia    Current Outpatient Prescriptions on File Prior to Visit  Medication Sig Dispense Refill  . calcium-vitamin D (OSCAL WITH D) 500-200 MG-UNIT per tablet Take 1 tablet by mouth daily.        . Cholecalciferol (VITAMIN D-3) 1000 UNITS CAPS Take 1,000 Units by mouth daily.      Marland Kitchen  fish oil-omega-3 fatty acids 1000 MG capsule Take 1 g by mouth daily.        Marland Kitchen ibandronate (BONIVA) 150 MG tablet TAKE 1 TAB EVERY 30 DAYS IN  MORNING WITH FULL GLASS WATER ON EMPTY STOMACH, DO NOT TAKE ANYTHING ELSE OR LIE DOWN FOR NEXT 60 MINS  3 tablet  4  . levothyroxine (SYNTHROID, LEVOTHROID) 50 MCG tablet TAKE 1 TABLET DAILY  90 tablet  3  . loratadine (CLARITIN) 10 MG tablet Take 10 mg by mouth daily.        . metoprolol succinate (TOPROL-XL) 50 MG 24 hr tablet Take 1 tablet (50 mg total) by mouth daily. Take with or immediately following a meal.  90 tablet  3  . Multiple Vitamin (MULTIVITAMIN) tablet Take 1 tablet by mouth daily.        Marland Kitchen omeprazole (PRILOSEC) 20 MG capsule TAKE 1 CAPSULE DAILY  90 capsule  2  . simvastatin (ZOCOR) 20 MG tablet TAKE 1 TABLET (20 MG TOTAL) AT BEDTIME  90 tablet  3   No current facility-administered medications on file prior to visit.    BP 120/60  Pulse 62  Temp(Src) 98.1 F  (36.7 C) (Oral)  Wt 120 lb (54.432 kg)chart    Objective:   Physical Exam  Constitutional: She is oriented to person, place, and time. She appears well-developed and well-nourished. She is active.  HENT:  Head: Normocephalic.  Right Ear: Tympanic membrane, external ear and ear canal normal.  Left Ear: Tympanic membrane, external ear and ear canal normal.  Nose: Rhinorrhea present.  Mouth/Throat: Uvula is midline, oropharynx is clear and moist and mucous membranes are normal.  Cardiovascular: Normal rate, regular rhythm and normal heart sounds.   Pulmonary/Chest: Effort normal and breath sounds normal.  Neurological: She is alert and oriented to person, place, and time.  Skin: Skin is warm, dry and intact.  Psychiatric: She has a normal mood and affect. Her speech is normal and behavior is normal. Judgment and thought content normal. Cognition and memory are normal.          Assessment & Plan:  63 y.o. White female presents today with chief complaint of "sinus infection".  - Viral sinusitis: Start Flonase 2 puffs per nostril Qday.   - Start taking zyrtec OTC po Qday.  - Education: avoid allergens. Increase fluid intake to be sure intake is at least 64 oz per day.  - Follow up: As needed.

## 2013-09-30 NOTE — Progress Notes (Signed)
Pre visit review using our clinic review tool, if applicable. No additional management support is needed unless otherwise documented below in the visit note. 

## 2013-09-30 NOTE — Patient Instructions (Signed)

## 2013-10-02 ENCOUNTER — Telehealth: Payer: Self-pay | Admitting: Family Medicine

## 2013-10-02 MED ORDER — METHYLPREDNISOLONE 4 MG PO KIT: PACK | ORAL | Status: AC

## 2013-10-02 MED ORDER — METHYLPREDNISOLONE 4 MG PO KIT: PACK | ORAL | Status: DC

## 2013-10-02 NOTE — Telephone Encounter (Signed)
Patient Information:  Caller Name: Ashaya  Phone: [phone]  Patient: Kristy [name], Kristy [name]  Gender: Female  DOB: [birth year]  Age: 63 Years  PCP: Alysia Penna [facility])  Office Follow Up:  Does the office need to follow up with this patient?: Yes  Instructions For The Office: Please advise pt   Symptoms  Reason For Call & Symptoms: Pt was seen on Monday 09/30/13 and prescribed Flonase. Pt started it on MOnday evening 09/30/13 . Initially it cleared her nose/sinuses so she could breathe.  In the am she had intense pain on her left nasal area/radiating to her sinuses and around the side of the eye on the left hand side intermittently - 30 min at a time. She has read the medication insert which advised if pt had rebound pain to call and notify MD. Pt states the pain lasted all day in 20 - 30 min increments until bedtime. She awoke with another episode at 4am. She has not taken the Flonase since. She has been treated for a sinus infection back to back with Amoxicillin/Augmentin. Should she continue the medication or should she stop it. Does this mean there is an infection in that sinus?  Reviewed Health History In EMR: Yes  Reviewed Medications In EMR: Yes  Reviewed Allergies In EMR: Yes  Reviewed Surgeries / Procedures: Yes  Date of Onset of Symptoms: 09/30/2013  Guideline(s) Used:  Sinus Pain and Congestion  Disposition Per Guideline:   See Today in Office  Reason For Disposition Reached:   Sinus pain (not just congestion) and fever  Advice Given:  N/A  RN Overrode Recommendation:  Patient Requests Prescription  Pt wanting to know if she needs another appt? does this indicate a sinus infection? what is next step.

## 2013-10-02 NOTE — Telephone Encounter (Signed)
Pt aware.

## 2013-10-02 NOTE — Telephone Encounter (Signed)
Does not indicate infection. Stop Flonase I will send in Medrol pack.

## 2013-10-02 NOTE — Addendum Note (Signed)
Addended by: Santiago Bumpers on: 10/02/2013 01:48 PM   Modules accepted: Orders

## 2013-10-18 ENCOUNTER — Other Ambulatory Visit: Payer: Self-pay | Admitting: Family Medicine

## 2013-10-18 NOTE — Telephone Encounter (Signed)
Can we refill this? 

## 2013-10-25 ENCOUNTER — Other Ambulatory Visit: Payer: Self-pay | Admitting: Family Medicine

## 2013-10-28 ENCOUNTER — Ambulatory Visit (INDEPENDENT_AMBULATORY_CARE_PROVIDER_SITE_OTHER): Payer: BC Managed Care – PPO | Admitting: Physician Assistant

## 2013-10-28 ENCOUNTER — Encounter: Payer: Self-pay | Admitting: Physician Assistant

## 2013-10-28 VITALS — BP 120/72 | HR 81 | Temp 97.9°F | Resp 16 | Wt 118.0 lb

## 2013-10-28 DIAGNOSIS — R300 Dysuria: Secondary | ICD-10-CM

## 2013-10-28 DIAGNOSIS — N390 Urinary tract infection, site not specified: Secondary | ICD-10-CM

## 2013-10-28 LAB — POCT URINALYSIS DIPSTICK
BILIRUBIN UA: NEGATIVE
Glucose, UA: NEGATIVE
Ketones, UA: NEGATIVE
Nitrite, UA: NEGATIVE
SPEC GRAV UA: 1.01
Urobilinogen, UA: 0.2
pH, UA: 6.5

## 2013-10-28 MED ORDER — CEFUROXIME AXETIL 250 MG PO TABS: 250.0000 mg | ORAL_TABLET | Freq: Two times a day (BID) | ORAL | Status: DC

## 2013-10-28 NOTE — Progress Notes (Signed)
Pre visit review using our clinic review tool, if applicable. No additional management support is needed unless otherwise documented below in the visit note. 

## 2013-10-28 NOTE — Patient Instructions (Signed)
Cefuroxime Twice daily with food for 10 days for UTI symptoms.  Drink plenty of fluids.  Follow up to clinic if symptoms worsen or do not improve despite treatment.  Urinary Tract Infection A urinary tract infection (UTI) can occur any place along the urinary tract. The tract includes the kidneys, ureters, bladder, and urethra. A type of germ called bacteria often causes a UTI. UTIs are often helped with antibiotic medicine.  HOME CARE   If given, take antibiotics as told by your doctor. Finish them even if you start to feel better.  Drink enough fluids to keep your pee (urine) clear or pale yellow.  Avoid tea, drinks with caffeine, and bubbly (carbonated) drinks.  Pee often. Avoid holding your pee in for a long time.  Pee before and after having sex (intercourse).  Wipe from front to back after you poop (bowel movement) if you are a woman. Use each tissue only once. GET HELP RIGHT AWAY IF:   You have back pain.  You have lower belly (abdominal) pain.  You have chills.  You feel sick to your stomach (nauseous).  You throw up (vomit).  Your burning or discomfort with peeing does not go away.  You have a fever.  Your symptoms are not better in 3 days. MAKE SURE YOU:   Understand these instructions.  Will watch your condition.  Will get help right away if you are not doing well or get worse. Document Released: 11/30/2007 Document Revised: 03/07/2012 Document Reviewed: 01/12/2012 [facility] Patient Information 2014 [place], [place].

## 2013-10-28 NOTE — Progress Notes (Signed)
Subjective:    Patient ID: Kristy [name], female    DOB: 03/07/1951, 63 y.o.   MRN: [id number]  Urinary Frequency  This is a new problem. The current episode started in the past 7 days. The problem occurs every urination. The problem has been unchanged. The quality of the pain is described as aching. The pain is at a severity of 1/10. The pain is mild. There has been no fever. There is no history of pyelonephritis. Associated symptoms include frequency and urgency. Pertinent negatives include no chills, discharge, flank pain, hematuria, hesitancy, nausea, possible pregnancy, sweats or vomiting. She has tried nothing for the symptoms. Her past medical history is significant for a urological procedure (cystoscopy in 2011). There is no history of kidney stones, recurrent UTIs or urinary stasis.     Review of Systems  Constitutional: Negative for fever and chills.  Respiratory: Negative for shortness of breath.   Gastrointestinal: Negative for nausea, vomiting and diarrhea.  Genitourinary: Positive for dysuria, urgency and frequency. Negative for hesitancy, hematuria and flank pain.  All other systems reviewed and are negative.  Past Medical History  Diagnosis Date  . Cancer 1998    breast  . Blood in stool   . Osteoporosis     last DEXA 07-18-12  . GERD (gastroesophageal reflux disease)   . Allergy   . Hyperlipidemia   . Hypothyroidism   . Gynecologic exam normal     sees Dr. Christophe Louis  . Posterior vitreous detachment of left eye     sees Dr. Starling Manns    Past Surgical History  Procedure Laterality Date  . Tonsillectomy  1959  . Breast lumpectomy  1998    left  . Colonoscopy  08-26-11    per Dr. Ardis Hughs, polyps, repeat in 5 yrs     reports that she has never smoked. She has never used smokeless tobacco. She reports that she does not drink alcohol or use illicit drugs. family history includes ALS in an other family member; Arthritis in an other family member; Cancer in her  mother; Colon cancer (age of onset: 56) in her mother; Colon cancer (age of onset: 33) in her paternal aunt; Heart attack in her father; Heart disease in her father and another family member; Hyperlipidemia in an other family member; Hypertension in her father, mother, and another family member; Mental illness in an other family member. There is no history of Stomach cancer. Allergies  Allergen Reactions  . Levofloxacin     REACTION: neuralgia        Objective:   Physical Exam  Nursing note and vitals reviewed. Constitutional: She is oriented to person, place, and time. She appears well-developed and well-nourished. No distress.  HENT:  Head: Normocephalic and atraumatic.  Eyes: Conjunctivae and EOM are normal. Pupils are equal, round, and reactive to light.  Neck: Normal range of motion.  Cardiovascular: Normal rate, regular rhythm, normal heart sounds and intact distal pulses.  Exam reveals no gallop and no friction rub.   No murmur heard. Pulmonary/Chest: Effort normal and breath sounds normal. No respiratory distress. She has no wheezes. She has no rales. She exhibits no tenderness.  Musculoskeletal: Normal range of motion.  Neurological: She is alert and oriented to person, place, and time.  Skin: She is not diaphoretic.  Psychiatric: She has a normal mood and affect. Her behavior is normal. Judgment and thought content normal.   Filed Vitals:   10/28/13 1431  BP: 120/72  Pulse: 81  Temp:  97.9 F (36.6 C)  Resp: 16   Lab Results  Component Value Date   WBC 7.8 07/12/2013   HGB 13.4 07/12/2013   HCT 39.7 07/12/2013   PLT 222.0 07/12/2013   GLUCOSE 92 07/12/2013   CHOL 158 07/12/2013   TRIG 76.0 07/12/2013   HDL 56.60 07/12/2013   LDLDIRECT 132.6 06/18/2010   LDLCALC 86 07/12/2013   ALT 20 07/12/2013   AST 22 07/12/2013   NA 136 07/12/2013   K 3.7 07/12/2013   CL 103 07/12/2013   CREATININE 0.7 07/12/2013   BUN 15 07/12/2013   CO2 26 07/12/2013   TSH 1.69 07/12/2013    Urinalysis Component     Latest Ref Rng 10/28/2013  Color, UA      yellow  Clarity, UA      slightly cloudy  Glucose      n  Bilirubin, UA      n  Ketones, UA      n  Specific Gravity, UA      1.010  RBC, UA      1+  pH, UA      6.5  Protein, UA      1+  Urobilinogen, UA      0.2  Nitrite, UA      n  Leukocytes, UA      large (3+)         Assessment & Plan:  Kristy [name] was seen today for urinary frequency.  Diagnoses and associated orders for this visit:  Dysuria - POCT urinalysis dipstick - Culture, Urine  UTI (urinary tract infection) - cefUROXime (CEFTIN) 250 MG tablet; Take 1 tablet (250 mg total) by mouth 2 (two) times daily with a meal.    Patient Instructions  Cefuroxime Twice daily with food for 10 days for UTI symptoms.  Drink plenty of fluids.  Follow up to clinic if symptoms worsen or do not improve despite treatment.

## 2013-10-31 LAB — URINE CULTURE

## 2013-11-11 ENCOUNTER — Ambulatory Visit (INDEPENDENT_AMBULATORY_CARE_PROVIDER_SITE_OTHER): Payer: BC Managed Care – PPO | Admitting: Family Medicine

## 2013-11-11 ENCOUNTER — Encounter: Payer: Self-pay | Admitting: Family Medicine

## 2013-11-11 VITALS — BP 140/82 | HR 67 | Temp 98.9°F | Ht 64.0 in | Wt 118.0 lb

## 2013-11-11 DIAGNOSIS — N390 Urinary tract infection, site not specified: Secondary | ICD-10-CM

## 2013-11-11 LAB — POCT URINALYSIS DIPSTICK
Bilirubin, UA: NEGATIVE
Glucose, UA: NEGATIVE
KETONES UA: NEGATIVE
Nitrite, UA: NEGATIVE
PH UA: 6.5
Protein, UA: NEGATIVE
Spec Grav, UA: 1.015
UROBILINOGEN UA: 0.2

## 2013-11-11 MED ORDER — CIPROFLOXACIN HCL 500 MG PO TABS: 500.0000 mg | ORAL_TABLET | Freq: Two times a day (BID) | ORAL | Status: DC

## 2013-11-11 NOTE — Addendum Note (Signed)
Addended by: Aggie Hacker A on: 11/11/2013 01:24 PM   Modules accepted: Orders

## 2013-11-11 NOTE — Progress Notes (Signed)
   Subjective:    Patient ID: Kristy [name], female    DOB: 1951-01-02, 63 y.o.   MRN: [id number]  HPI Here to recheck a UTI. She was seen on 10-28-13 for urinary burning and urgency and was started on Ceftin. Her culture grew E coli, which appeared to be sensitive to cephalosporins among other things. However she still has some urgency and mild discomfort. No fever. She is drinking lots of water.    Review of Systems  Constitutional: Negative.   Genitourinary: Positive for dysuria, urgency and frequency. Negative for hematuria, flank pain and pelvic pain.       Objective:   Physical Exam  Constitutional: She appears well-developed and well-nourished.  Abdominal: Soft. Bowel sounds are normal. She exhibits no distension and no mass. There is no tenderness. There is no rebound and no guarding.          Assessment & Plan:  Try Cipro for 10 days

## 2013-11-11 NOTE — Progress Notes (Signed)
Pre visit review using our clinic review tool, if applicable. No additional management support is needed unless otherwise documented below in the visit note. 

## 2013-11-12 ENCOUNTER — Encounter: Payer: Self-pay | Admitting: Family Medicine

## 2013-11-12 ENCOUNTER — Ambulatory Visit (INDEPENDENT_AMBULATORY_CARE_PROVIDER_SITE_OTHER): Payer: BC Managed Care – PPO | Admitting: Family Medicine

## 2013-11-12 VITALS — BP 119/73 | HR 81 | Temp 99.0°F | Ht 64.0 in | Wt 118.0 lb

## 2013-11-12 DIAGNOSIS — N390 Urinary tract infection, site not specified: Secondary | ICD-10-CM

## 2013-11-12 DIAGNOSIS — R197 Diarrhea, unspecified: Secondary | ICD-10-CM

## 2013-11-12 MED ORDER — NITROFURANTOIN MONOHYD MACRO 100 MG PO CAPS: 100.0000 mg | ORAL_CAPSULE | Freq: Two times a day (BID) | ORAL | Status: DC

## 2013-11-12 NOTE — Progress Notes (Signed)
Pre visit review using our clinic review tool, if applicable. No additional management support is needed unless otherwise documented below in the visit note. 

## 2013-11-12 NOTE — Progress Notes (Signed)
   Subjective:    Patient ID: Kristy [name], female    DOB: 1951/03/17, 63 y.o.   MRN: [id number]  HPI Here for 2 days of diarrhea and cold chills, which she thinks are reactions to Cipro. She has taken Cipro before has done well with it. We have been treating a UTI with this. Her urinary burning and urgency are improved. No nausea or vomiting.    Review of Systems  Constitutional: Positive for chills. Negative for fever and diaphoresis.  Gastrointestinal: Positive for diarrhea. Negative for nausea, vomiting, abdominal pain, constipation, blood in stool, abdominal distention and rectal pain.  Genitourinary: Negative.        Objective:   Physical Exam  Constitutional: She appears well-developed and well-nourished.  Abdominal: Soft. Bowel sounds are normal. She exhibits no distension and no mass. There is no tenderness. There is no rebound and no guarding.          Assessment & Plan:  Her diarrhea is likely from the Cirpo so we will stop this. Switch to Macrobid for 7 days. Drink plenty of fluids.

## 2013-11-13 ENCOUNTER — Encounter: Payer: Self-pay | Admitting: Family Medicine

## 2013-11-19 ENCOUNTER — Telehealth: Payer: Self-pay | Admitting: Family Medicine

## 2013-11-19 ENCOUNTER — Encounter: Payer: Self-pay | Admitting: Family Medicine

## 2013-11-19 ENCOUNTER — Ambulatory Visit (INDEPENDENT_AMBULATORY_CARE_PROVIDER_SITE_OTHER): Payer: BC Managed Care – PPO | Admitting: Family Medicine

## 2013-11-19 VITALS — BP 134/76 | HR 70 | Temp 98.4°F | Ht 64.0 in | Wt 116.0 lb

## 2013-11-19 DIAGNOSIS — N390 Urinary tract infection, site not specified: Secondary | ICD-10-CM

## 2013-11-19 LAB — POCT URINALYSIS DIPSTICK
BILIRUBIN UA: NEGATIVE
Glucose, UA: NEGATIVE
Ketones, UA: NEGATIVE
LEUKOCYTES UA: NEGATIVE
NITRITE UA: NEGATIVE
Spec Grav, UA: <=1.005
Urobilinogen, UA: 0.2
pH, UA: 6.5

## 2013-11-19 NOTE — Telephone Encounter (Signed)
Noted  

## 2013-11-19 NOTE — Progress Notes (Signed)
   Subjective:    Patient ID: Kristy [name], female    DOB: [birth year], 63 y.o.   MRN: [id number]  HPI Here to recheck her UTI. She was here 7 days ago when we switched from Cipro to [facility], and she took the last pill this am. She feels better with no burning at all, but she feels that she is going a little more frequently than she should.    Review of Systems  Constitutional: Negative.   Genitourinary: Positive for frequency. Negative for dysuria, urgency, hematuria, flank pain and difficulty urinating.       Objective:   Physical Exam  Constitutional: She appears well-developed and well-nourished.  Abdominal: Soft. Bowel sounds are normal. She exhibits no distension and no mass. There is no tenderness. There is no rebound and no guarding.          Assessment & Plan:  Her UA is clear. The UTI has resolved.

## 2013-11-19 NOTE — Addendum Note (Signed)
Addended by: Aggie Hacker A on: 11/19/2013 12:14 PM   Modules accepted: Orders

## 2013-11-19 NOTE — Telephone Encounter (Signed)
Patient Information:  Caller Name: Breezie  Phone: [phone]  Patient: Kristy [name], Kristy [name]  Gender: Female  DOB: 05-30-1951  Age: 63 Years  PCP: Alysia Penna [facility])  Office Follow Up:  Does the office need to follow up with this patient?: No  Instructions For The Office: N/A   Symptoms  Reason For Call & Symptoms: 10/28/13 dx with UTI placed on Ceftin.  11/11/13 UTI continues, placed on Cipro.  Pt had diarrhea and chills.  11/13/03 changed to [place] due to possible side effects of CIpro.  11/19/13 pt takes last Macrobid this morning.   Pt continues to have urinary frequency, but no other pain or sxs she has had previously.   Pt has been drinking water and cranberry juice.   Afebrile.  Reviewed Health History In EMR: Yes  Reviewed Medications In EMR: Yes  Reviewed Allergies In EMR: Yes  Reviewed Surgeries / Procedures: Yes  Date of Onset of Symptoms: 10/28/2013  Guideline(s) Used:  Urination Pain - Female  Disposition Per Guideline:   See Today in Office  Reason For Disposition Reached:   Age > 50 years  Advice Given:  Fluids:   Drink extra fluids. Drink 8-10 glasses of liquids a day (Reason: to produce a dilute, non-irritating urine).  Call Back If:  You become worse.  Patient Will Follow Care Advice:  YES  Appointment Scheduled:  11/19/2013 10:45:00 Appointment Scheduled Provider:  Alysia Penna [facility])

## 2013-11-19 NOTE — Progress Notes (Signed)
Pre visit review using our clinic review tool, if applicable. No additional management support is needed unless otherwise documented below in the visit note. 

## 2014-01-17 ENCOUNTER — Other Ambulatory Visit: Payer: Self-pay

## 2014-01-17 DIAGNOSIS — Z9889 Other specified postprocedural states: Secondary | ICD-10-CM

## 2014-01-17 DIAGNOSIS — Z1231 Encounter for screening mammogram for malignant neoplasm of breast: Secondary | ICD-10-CM

## 2014-01-17 DIAGNOSIS — Z853 Personal history of malignant neoplasm of breast: Secondary | ICD-10-CM

## 2014-02-19 ENCOUNTER — Ambulatory Visit
Admission: RE | Admit: 2014-02-19 | Discharge: 2014-02-19 | Disposition: A | Discharge status: Home / Self Care | Payer: BC Managed Care – PPO | Source: Ambulatory Visit

## 2014-02-19 DIAGNOSIS — Z1231 Encounter for screening mammogram for malignant neoplasm of breast: Secondary | ICD-10-CM

## 2014-02-19 DIAGNOSIS — Z853 Personal history of malignant neoplasm of breast: Secondary | ICD-10-CM

## 2014-02-19 DIAGNOSIS — Z9889 Other specified postprocedural states: Secondary | ICD-10-CM

## 2014-03-06 ENCOUNTER — Encounter: Payer: Self-pay | Admitting: Gastroenterology

## 2014-03-27 ENCOUNTER — Encounter: Payer: Self-pay | Admitting: Family Medicine

## 2014-03-27 ENCOUNTER — Ambulatory Visit (INDEPENDENT_AMBULATORY_CARE_PROVIDER_SITE_OTHER): Payer: BC Managed Care – PPO | Admitting: Family Medicine

## 2014-03-27 VITALS — BP 122/67 | HR 70 | Temp 98.2°F | Ht 64.0 in | Wt 119.0 lb

## 2014-03-27 DIAGNOSIS — R3915 Urgency of urination: Secondary | ICD-10-CM

## 2014-03-27 DIAGNOSIS — N3001 Acute cystitis with hematuria: Secondary | ICD-10-CM

## 2014-03-27 LAB — POCT URINALYSIS DIPSTICK
Bilirubin, UA: NEGATIVE
Glucose, UA: NEGATIVE
Ketones, UA: NEGATIVE
NITRITE UA: NEGATIVE
PH UA: 6
PROTEIN UA: NEGATIVE
Spec Grav, UA: 1.01
Urobilinogen, UA: 0.2

## 2014-03-27 NOTE — Progress Notes (Signed)
Pre visit review using our clinic review tool, if applicable. No additional management support is needed unless otherwise documented below in the visit note. 

## 2014-03-27 NOTE — Addendum Note (Signed)
Addended by: Aggie Hacker A on: 03/27/2014 11:38 AM   Modules accepted: Orders

## 2014-03-27 NOTE — Progress Notes (Signed)
   Subjective:    Patient ID: Kristy [name], female    DOB: 06-14-1951, 63 y.o.   MRN: [id number]  HPI Here for 2 days of mildly increased frequency and urgency to urinate. No fever or burning. Drinking plenty of water.    Review of Systems  Constitutional: Negative.   Genitourinary: Positive for urgency and frequency. Negative for dysuria, flank pain and pelvic pain.       Objective:   Physical Exam  Constitutional: She appears well-developed and well-nourished.  Abdominal: Soft. Bowel sounds are normal. She exhibits no distension and no mass. There is no tenderness. There is no rebound and no guarding.          Assessment & Plan:  It is unclear if this is a UTI or not. We will culture the sample and see what this shows [place].

## 2014-03-28 LAB — URINE CULTURE
Colony Count: NO GROWTH
Organism ID, Bacteria: NO GROWTH

## 2014-05-29 ENCOUNTER — Other Ambulatory Visit: Payer: Self-pay | Admitting: Family Medicine

## 2014-07-14 ENCOUNTER — Other Ambulatory Visit (INDEPENDENT_AMBULATORY_CARE_PROVIDER_SITE_OTHER): Payer: BC Managed Care – PPO | Admitting: *Deleted

## 2014-07-14 DIAGNOSIS — Z0000 Encounter for general adult medical examination without abnormal findings: Secondary | ICD-10-CM

## 2014-07-15 ENCOUNTER — Other Ambulatory Visit (INDEPENDENT_AMBULATORY_CARE_PROVIDER_SITE_OTHER): Payer: BC Managed Care – PPO

## 2014-07-15 DIAGNOSIS — Z0000 Encounter for general adult medical examination without abnormal findings: Secondary | ICD-10-CM

## 2014-07-15 LAB — CBC WITH DIFFERENTIAL/PLATELET
BASOS ABS: 0 10*3/uL (ref 0.0–0.1)
Basophils Relative: 0.5 % (ref 0.0–3.0)
EOS ABS: 0.2 10*3/uL (ref 0.0–0.7)
EOS PCT: 3.7 % (ref 0.0–5.0)
HCT: 41 % (ref 36.0–46.0)
Hemoglobin: 13.6 g/dL (ref 12.0–15.0)
Lymphocytes Relative: 36.1 % (ref 12.0–46.0)
Lymphs Abs: 2.3 10*3/uL (ref 0.7–4.0)
MCHC: 33.1 g/dL (ref 30.0–36.0)
MCV: 94.9 fl (ref 78.0–100.0)
MONO ABS: 0.5 10*3/uL (ref 0.1–1.0)
MONOS PCT: 8 % (ref 3.0–12.0)
NEUTROS PCT: 51.7 % (ref 43.0–77.0)
Neutro Abs: 3.4 10*3/uL (ref 1.4–7.7)
Platelets: 249 10*3/uL (ref 150.0–400.0)
RBC: 4.32 Mil/uL (ref 3.87–5.11)
RDW: 13.3 % (ref 11.5–15.5)
WBC: 6.5 10*3/uL (ref 4.0–10.5)

## 2014-07-15 LAB — COMPREHENSIVE METABOLIC PANEL
ALK PHOS: 23 U/L — AB (ref 39–117)
ALT: 17 U/L (ref 0–35)
AST: 21 U/L (ref 0–37)
Albumin: 4.2 g/dL (ref 3.5–5.2)
BUN: 14 mg/dL (ref 6–23)
CO2: 27 meq/L (ref 19–32)
Calcium: 9.6 mg/dL (ref 8.4–10.5)
Chloride: 102 mEq/L (ref 96–112)
Creatinine, Ser: 0.76 mg/dL (ref 0.40–1.20)
GFR: 82.1 mL/min (ref >60.00)
Glucose, Bld: 95 mg/dL (ref 70–99)
Potassium: 4 mEq/L (ref 3.5–5.1)
SODIUM: 136 meq/L (ref 135–145)
TOTAL PROTEIN: 6.9 g/dL (ref 6.0–8.3)
Total Bilirubin: 0.5 mg/dL (ref 0.2–1.2)

## 2014-07-15 LAB — POCT URINALYSIS DIPSTICK
Bilirubin, UA: NEGATIVE
GLUCOSE UA: NEGATIVE
Ketones, UA: NEGATIVE
Leukocytes, UA: NEGATIVE
Nitrite, UA: NEGATIVE
PH UA: 6
PROTEIN UA: NEGATIVE
Spec Grav, UA: 1.01
Urobilinogen, UA: 0.2

## 2014-07-15 LAB — LIPID PANEL
CHOLESTEROL: 169 mg/dL (ref 0–200)
HDL: 52 mg/dL (ref >39.00)
LDL CALC: 96 mg/dL (ref 0–99)
NonHDL: 117
Total CHOL/HDL Ratio: 3
Triglycerides: 106 mg/dL (ref 0.0–149.0)
VLDL: 21.2 mg/dL (ref 0.0–40.0)

## 2014-07-15 LAB — TSH: TSH: 3.75 u[IU]/mL (ref 0.35–4.50)

## 2014-07-21 ENCOUNTER — Encounter: Payer: BC Managed Care – PPO | Admitting: Family Medicine

## 2014-07-23 ENCOUNTER — Other Ambulatory Visit: Payer: Self-pay | Admitting: *Deleted

## 2014-07-23 ENCOUNTER — Encounter: Payer: Self-pay | Admitting: Cardiology

## 2014-07-23 ENCOUNTER — Ambulatory Visit (INDEPENDENT_AMBULATORY_CARE_PROVIDER_SITE_OTHER): Payer: BC Managed Care – PPO | Admitting: Cardiology

## 2014-07-23 VITALS — BP 132/78 | HR 66 | Ht 63.5 in | Wt 118.8 lb

## 2014-07-23 DIAGNOSIS — R002 Palpitations: Secondary | ICD-10-CM

## 2014-07-23 DIAGNOSIS — R0789 Other chest pain: Secondary | ICD-10-CM

## 2014-07-23 NOTE — Progress Notes (Signed)
[place]. [address], [address], [place]  [address] Phone: [phone] Fax:  [phone]  Date:  07/23/2014   ID:  Kristy [name], DOB 05/07/1951, MRN [id number]  PCP:  Laurey Morale, MD   History of Present Illness: Kristy [name] is a 64 y.o. female here for the follow-up of palpitations, NSVT. Reassuring workup with echocardiogram and exercise treadmill test in December 2014 normal.   Back in 1/14 had few palpitations at the beach. Like a "flutter". Over a few months they lessened. In September, came back. Started metoprolol. Then came back again, a little bit. Went up to 50mg  of metoprolol. No longer flutters but having thumping like a baby kicking. Maybe cold or lifting previously effected. Seemed to happen more with lifting. Off caffeine. They are increasing in frequency and are sometimes lasting longer durations. At times, they can last one hour in duration. They feel like a pounding. They do not seem to be prolonged racing episodes. Denies any syncope, angina, chest pain, shortness of breath. However, she does state that when walking up [place], she can feel shortness of breath which has been going on for quite some time.  She has had experience of vasovagal syncope during blood draws or procedures. She's currently taking medication for hyperlipidemia. 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 She has a history of left breast lumpectomy, intraductal.  Father had 3 MI's in his 48's. [facility]. ALS.      07/23/14 - was doing well until mid December. Started having fluttering feeling left flank/ breast. Had radiation prior. When arm is certain position feels a twitch. ? Heart she states. Stressful fall, mother died in March 30, 2023. Family pressures. Sister with new boyfriend. Now feels better. Atypical CP resolved with eating.    Wt Readings from Last 3 Encounters:  07/23/14 118 lb 12.8 oz (53.887 kg)  03/27/14 119 lb (53.978 kg)  11/19/13 116 lb  (52.617 kg)     Past Medical History  Diagnosis Date  . Cancer 1998    breast  . Blood in stool   . Osteoporosis     last DEXA 07-18-12  . GERD (gastroesophageal reflux disease)   . Allergy   . Hyperlipidemia   . Hypothyroidism   . Gynecologic exam normal     sees Dr. Christophe Louis  . Posterior vitreous detachment of left eye     sees Dr. Starling Manns     Past Surgical History  Procedure Laterality Date  . Tonsillectomy  1959  . Breast lumpectomy  1998    left  . Colonoscopy  08-26-11    per Dr. Ardis Hughs, polyps, repeat in 5 yrs     Current Outpatient Prescriptions  Medication Sig Dispense Refill  . calcium-vitamin D (OSCAL WITH D) 500-200 MG-UNIT per tablet Take 1 tablet by mouth daily.      . Cholecalciferol (VITAMIN D-3) 1000 UNITS CAPS Take 1,000 Units by mouth daily.    . fish oil-omega-3 fatty acids 1000 MG capsule Take 1 g by mouth daily.      Marland Kitchen ibandronate (BONIVA) 150 MG tablet TAKE AS INSTRUCTED BY YOUR PRESCRIBER 3 tablet 3  . levothyroxine (SYNTHROID, LEVOTHROID) 50 MCG tablet TAKE 1 TABLET DAILY 90 tablet 3  . loratadine (CLARITIN) 10 MG tablet Take 10 mg by mouth daily.      . metoprolol succinate (TOPROL-XL) 50 MG 24 hr tablet TAKE 1 TABLET DAILY WITH OR IMMEDIATELY FOLLOWING A MEAL  90 tablet 0  . Multiple Vitamin (MULTIVITAMIN) tablet Take 1 tablet by mouth daily.      Marland Kitchen omeprazole (PRILOSEC) 20 MG capsule TAKE 1 CAPSULE DAILY 90 capsule 2  . simvastatin (ZOCOR) 20 MG tablet TAKE 1 TABLET AT BEDTIME 90 tablet 3   No current facility-administered medications for this visit.    Allergies:    Allergies  Allergen Reactions  . Ciprofloxacin     Diarrhea   . Levofloxacin     REACTION: neuralgia    Social History:  The patient  reports that she has never smoked. She has never used smokeless tobacco. She reports that she does not drink alcohol or use illicit drugs.   Family History  Problem Relation Age of Onset  . ALS      fhx  . Arthritis      fhx  .  Hyperlipidemia      fhx  . Hypertension      fhx  . Mental illness      fhx  . Heart disease      fhx  . Colon cancer Mother 26  . Stomach cancer Neg Hx   . Colon cancer Paternal Aunt 78  . Heart disease Father   . Heart attack Father   . Hypertension Mother   . Hypertension Father   . Cancer Mother     ROS:  Please see the history of present illness.   Denies any strokelike symptoms, bleeding, orthopnea, PND, rashes, fever, prior thyroid abnormalities.   All other systems reviewed and negative.   PHYSICAL EXAM: VS:  BP 132/78 mmHg  Pulse 66  Ht 5' 3.5" (1.613 m)  Wt 118 lb 12.8 oz (53.887 kg)  BMI 20.71 kg/m2 Well nourished, well developed, in no acute distress HEENT: normal, [place]/AT, EOMI Neck: no JVD, normal carotid upstroke, no bruit Cardiac:  normal S1, S2; RRR; no murmur Lungs:  clear to auscultation bilaterally, no wheezing, rhonchi or rales Abd: soft, nontender, no hepatomegaly, no bruits Ext: no edema, 2+ distal pulses Skin: warm and dry GU: deferred Neuro: no focal abnormalities noted, AAO x 3  TSH 2.78 LDL 81 LFTs normal Hemoglobin 13.5  07/13/12-48 hour Holter monitor-occasional PACs, 6 beats NSVT at 4 AM, asymptomatic  EKG: 07/23/14 - NSR 66, no other changes.  Normal rhythm, 71, no other abnormalities, normal QT  Echocardiogram: 06/19/13-normal ejection fraction  ASSESSMENT AND PLAN:  1. Palpitations - intermittent, likely secondary to PACs/PVCs as seen on prior Holter monitor. However, she did have a 6 beat run of nonsustained ventricular tachycardia, heart rate of 158, at 4 AM, asymptomatic. Since the Holter monitor, she was placed on beta blocker. Echocardiogram performed, and showed proper structure and function especially with NSVT. She certainly has not had any syncopal episodes. Interestingly however, she does seem to have her palpitations occur mostly with activity. Usually, PVCs or PACs manifest themselves at rest. Agree with reduction in caffeine.  I'm fine with her eating and occasional chocolate. Thyroid reassuring. Electronically reassuring. 2. Atypical CP - intermittent random better with food. Reassurance 3. Dyspnea-No further. Reassuring ECHO. Assuring exercise treadmill test. 4. Nonsustained ventricular tachycardia-brief 6 beat run seen on prior Holter monitor. Exercise treadmill test showed no signs of ischemia especially with her father's family history of first myocardial infarction being at age 32. I agree with beta blocker. normal ejection fraction, continue with beta blocker. 5. One-year follow-up   Signed, Candee Furbish, MD [facility]  07/23/2014 11:10 AM

## 2014-07-23 NOTE — Patient Instructions (Signed)
Your physician recommends that you continue on your current medications as directed. Please refer to the Current Medication list given to you today.  Your physician wants you to follow-up in: 1 year with Dr. Skains. You will receive a reminder letter in the mail two months in advance. If you don't receive a letter, please call our office to schedule the follow-up appointment.  

## 2014-07-24 ENCOUNTER — Telehealth: Payer: Self-pay | Admitting: Family Medicine

## 2014-07-24 NOTE — Telephone Encounter (Signed)
I updated record.

## 2014-07-24 NOTE — Telephone Encounter (Signed)
fyi pt got flu shot on 06/21/14 at [place] [facility]

## 2014-07-27 ENCOUNTER — Other Ambulatory Visit: Payer: Self-pay | Admitting: Family Medicine

## 2014-07-30 ENCOUNTER — Encounter: Payer: Self-pay | Admitting: Family Medicine

## 2014-07-30 ENCOUNTER — Ambulatory Visit (INDEPENDENT_AMBULATORY_CARE_PROVIDER_SITE_OTHER): Payer: BC Managed Care – PPO | Admitting: Family Medicine

## 2014-07-30 VITALS — BP 107/61 | HR 60 | Temp 98.2°F | Ht 63.5 in | Wt 119.0 lb

## 2014-07-30 DIAGNOSIS — Z0000 Encounter for general adult medical examination without abnormal findings: Secondary | ICD-10-CM

## 2014-07-30 MED ORDER — METOPROLOL SUCCINATE ER 50 MG PO TB24: ORAL_TABLET | ORAL | Status: DC

## 2014-07-30 MED ORDER — OMEPRAZOLE 20 MG PO CPDR: 20.0000 mg | DELAYED_RELEASE_CAPSULE | Freq: Every day | ORAL | Status: DC

## 2014-07-30 NOTE — Progress Notes (Signed)
Pre visit review using our clinic review tool, if applicable. No additional management support is needed unless otherwise documented below in the visit note. 

## 2014-07-30 NOTE — Progress Notes (Signed)
   Subjective:    Patient ID: Kristy [name], female    DOB: Nov 16, 1950, 64 y.o.   MRN: [id number]  HPI 64 yr female for a cpx. She feels well.    Review of Systems  Constitutional: Negative.   HENT: Negative.   Eyes: Negative.   Respiratory: Negative.   Cardiovascular: Negative.   Gastrointestinal: Negative.   Genitourinary: Negative for dysuria, urgency, frequency, hematuria, flank pain, decreased urine volume, enuresis, difficulty urinating, pelvic pain and dyspareunia.  Musculoskeletal: Negative.   Skin: Negative.   Neurological: Negative.   Psychiatric/Behavioral: Negative.        Objective:   Physical Exam  Constitutional: She is oriented to person, place, and time. She appears well-developed and well-nourished. No distress.  HENT:  Head: Normocephalic and atraumatic.  Right Ear: External ear normal.  Left Ear: External ear normal.  Nose: Nose normal.  Mouth/Throat: Oropharynx is clear and moist. No oropharyngeal exudate.  Eyes: Conjunctivae and EOM are normal. Pupils are equal, round, and reactive to light. No scleral icterus.  Neck: Normal range of motion. Neck supple. No JVD present. No thyromegaly present.  Cardiovascular: Normal rate, regular rhythm, normal heart sounds and intact distal pulses.  Exam reveals no gallop and no friction rub.   No murmur heard. Pulmonary/Chest: Effort normal and breath sounds normal. No respiratory distress. She has no wheezes. She has no rales. She exhibits no tenderness.  Abdominal: Soft. Bowel sounds are normal. She exhibits no distension and no mass. There is no tenderness. There is no rebound and no guarding.  Musculoskeletal: Normal range of motion. She exhibits no edema or tenderness.  Lymphadenopathy:    She has no cervical adenopathy.  Neurological: She is alert and oriented to person, place, and time. She has normal reflexes. No cranial nerve deficit. She exhibits normal muscle tone. Coordination normal.  Skin: Skin is warm  and dry. No rash noted. No erythema.  Psychiatric: She has a normal mood and affect. Her behavior is normal. Judgment and thought content normal.          Assessment & Plan:  Well exam.

## 2014-09-04 ENCOUNTER — Other Ambulatory Visit: Payer: Self-pay | Admitting: Family Medicine

## 2014-09-08 ENCOUNTER — Other Ambulatory Visit (HOSPITAL_COMMUNITY)
Admission: RE | Admit: 2014-09-08 | Discharge: 2014-09-08 | Disposition: A | Discharge status: Home / Self Care | Payer: BC Managed Care – PPO | Source: Ambulatory Visit | Attending: Obstetrics and Gynecology | Admitting: Obstetrics and Gynecology

## 2014-09-08 ENCOUNTER — Other Ambulatory Visit: Payer: Self-pay | Admitting: Obstetrics and Gynecology

## 2014-09-08 DIAGNOSIS — Z01419 Encounter for gynecological examination (general) (routine) without abnormal findings: Secondary | ICD-10-CM | POA: Insufficient documentation

## 2014-09-09 LAB — CYTOLOGY - PAP

## 2014-09-20 ENCOUNTER — Other Ambulatory Visit: Payer: Self-pay | Admitting: Family Medicine

## 2014-09-22 ENCOUNTER — Telehealth: Payer: Self-pay | Admitting: Family Medicine

## 2014-09-22 NOTE — Telephone Encounter (Signed)
PA Approved. 08/23/14-09/22/15

## 2014-09-22 NOTE — Telephone Encounter (Signed)
Can we refill this? 

## 2014-09-22 NOTE — Telephone Encounter (Signed)
LMOM for patient about approval.

## 2014-09-22 NOTE — Telephone Encounter (Signed)
Are we working on a prior authorization for Omeprazole?

## 2014-09-30 NOTE — Telephone Encounter (Signed)
Refill this for one year  

## 2014-11-05 ENCOUNTER — Encounter: Payer: Self-pay | Admitting: Family Medicine

## 2014-11-05 ENCOUNTER — Ambulatory Visit (INDEPENDENT_AMBULATORY_CARE_PROVIDER_SITE_OTHER): Payer: BC Managed Care – PPO | Admitting: Family Medicine

## 2014-11-05 VITALS — BP 116/65 | HR 67 | Temp 98.5°F | Ht 63.5 in | Wt 116.0 lb

## 2014-11-05 DIAGNOSIS — K648 Other hemorrhoids: Secondary | ICD-10-CM | POA: Diagnosis not present

## 2014-11-05 DIAGNOSIS — K644 Residual hemorrhoidal skin tags: Secondary | ICD-10-CM

## 2014-11-05 NOTE — Progress Notes (Signed)
Pre visit review using our clinic review tool, if applicable. No additional management support is needed unless otherwise documented below in the visit note. 

## 2014-11-05 NOTE — Progress Notes (Signed)
   Subjective:    Patient ID: Kristy [name], female    DOB: 06-Apr-1951, 64 y.o.   MRN: [id number]  HPI Here for one episode of bright red blood on the paper when she wiped after a BM. This occurred 2 weeks ago and has not happened since. The BM was not painful. Her stools are easy to pass.    Review of Systems  Constitutional: Negative.   Gastrointestinal: Positive for anal bleeding. Negative for nausea, vomiting, abdominal pain, diarrhea, constipation, blood in stool, abdominal distention and rectal pain.       Objective:       Physical Exam  Constitutional: She appears well-developed and well-nourished.  Abdominal: Soft. Bowel sounds are normal. She exhibits no distension and no mass. There is no tenderness. There is no rebound and no guarding.  Genitourinary:  There are 3 small hemorrhoids at the anal verge. No active bleeding          Assessment & Plan:  She had some hemorrhoidal bleeding. We discussed getting more fiber in the diet and drinking more water. Recheck prn

## 2014-11-15 ENCOUNTER — Other Ambulatory Visit: Payer: Self-pay | Admitting: Family Medicine

## 2015-01-29 ENCOUNTER — Other Ambulatory Visit: Payer: Self-pay

## 2015-01-29 DIAGNOSIS — Z1231 Encounter for screening mammogram for malignant neoplasm of breast: Secondary | ICD-10-CM

## 2015-03-11 ENCOUNTER — Ambulatory Visit
Admission: RE | Admit: 2015-03-11 | Discharge: 2015-03-11 | Disposition: A | Discharge status: Home / Self Care | Payer: BC Managed Care – PPO | Source: Ambulatory Visit

## 2015-03-11 DIAGNOSIS — Z1231 Encounter for screening mammogram for malignant neoplasm of breast: Secondary | ICD-10-CM

## 2015-06-02 ENCOUNTER — Encounter: Payer: Self-pay | Admitting: Family Medicine

## 2015-06-02 ENCOUNTER — Ambulatory Visit (INDEPENDENT_AMBULATORY_CARE_PROVIDER_SITE_OTHER): Payer: BC Managed Care – PPO | Admitting: Family Medicine

## 2015-06-02 VITALS — BP 122/70 | HR 67 | Temp 98.5°F | Ht 63.5 in | Wt 120.0 lb

## 2015-06-02 DIAGNOSIS — N390 Urinary tract infection, site not specified: Secondary | ICD-10-CM

## 2015-06-02 DIAGNOSIS — R309 Painful micturition, unspecified: Secondary | ICD-10-CM

## 2015-06-02 LAB — POCT URINALYSIS DIPSTICK
BILIRUBIN UA: NEGATIVE
Glucose, UA: NEGATIVE
KETONES UA: NEGATIVE
Nitrite, UA: NEGATIVE
SPEC GRAV UA: 1.01
Urobilinogen, UA: 0.2
pH, UA: 6

## 2015-06-02 MED ORDER — SULFAMETHOXAZOLE-TRIMETHOPRIM 800-160 MG PO TABS: 1.0000 | ORAL_TABLET | Freq: Two times a day (BID) | ORAL | Status: DC

## 2015-06-02 MED ORDER — NITROFURANTOIN MONOHYD MACRO 100 MG PO CAPS: 100.0000 mg | ORAL_CAPSULE | Freq: Two times a day (BID) | ORAL | Status: DC

## 2015-06-02 NOTE — Progress Notes (Signed)
   Subjective:    Patient ID: Kristy [name], female    DOB: 02-Mar-1951, 64 y.o.   MRN: [id number]  HPI Here for 2 days of urgency to urinate and burning. No fever or nausea. Drinking lots of water. Her last 2 episodes of UTI were in May 2015 and July of this year, both times treated successfully with Macrobid.    Review of Systems  Constitutional: Negative.   Gastrointestinal: Negative.   Genitourinary: Positive for dysuria, urgency and frequency. Negative for hematuria and flank pain.       Objective:   Physical Exam  Constitutional: She appears well-developed and well-nourished. No distress.  Abdominal: Soft. Bowel sounds are normal. She exhibits no distension and no mass. There is no tenderness. There is no rebound and no guarding.          Assessment & Plan:  UTI, treat with Macrobid 100 mg bid. Culture the sample

## 2015-06-02 NOTE — Progress Notes (Signed)
Pre visit review using our clinic review tool, if applicable. No additional management support is needed unless otherwise documented below in the visit note. 

## 2015-06-05 LAB — URINE CULTURE: Colony Count: >=100000

## 2015-07-13 ENCOUNTER — Encounter: Payer: Self-pay | Admitting: Family Medicine

## 2015-07-13 ENCOUNTER — Ambulatory Visit (INDEPENDENT_AMBULATORY_CARE_PROVIDER_SITE_OTHER): Payer: BC Managed Care – PPO | Admitting: Family Medicine

## 2015-07-13 VITALS — BP 111/71 | HR 69 | Temp 98.2°F | Ht 63.5 in | Wt 117.0 lb

## 2015-07-13 DIAGNOSIS — N390 Urinary tract infection, site not specified: Secondary | ICD-10-CM | POA: Diagnosis not present

## 2015-07-13 DIAGNOSIS — R309 Painful micturition, unspecified: Secondary | ICD-10-CM | POA: Diagnosis not present

## 2015-07-13 LAB — POCT URINALYSIS DIPSTICK
Bilirubin, UA: NEGATIVE
Glucose, UA: NEGATIVE
Ketones, UA: NEGATIVE
NITRITE UA: NEGATIVE
PH UA: 7
Spec Grav, UA: 1.025
UROBILINOGEN UA: 0.2

## 2015-07-13 MED ORDER — NITROFURANTOIN MONOHYD MACRO 100 MG PO CAPS
100.0000 mg | ORAL_CAPSULE | Freq: Two times a day (BID) | ORAL | Status: DC
Start: 2015-07-13 — End: 2015-08-03

## 2015-07-13 NOTE — Progress Notes (Signed)
Pre visit review using our clinic review tool, if applicable. No additional management support is needed unless otherwise documented below in the visit note. 

## 2015-07-13 NOTE — Progress Notes (Signed)
   Subjective:    Patient ID: Kristy [name], female    DOB: 11/02/1950, 65 y.o.   MRN: [id number]  HPI Here for 3 days of urinary urgency and burning. No fever. She was treated for an E coli UTI last month with Macrobid, and she seemed to respond well. She drinks plenty of water.    Review of Systems  Constitutional: Negative.   Genitourinary: Positive for dysuria, urgency and frequency. Negative for flank pain.       Objective:   Physical Exam  Constitutional: She appears well-developed and well-nourished.  Abdominal: Soft. Bowel sounds are normal. She exhibits no distension and no mass. There is no tenderness. There is no rebound and no guarding.          Assessment & Plan:  Recurrent UTI. Treat with Macrobid and culture the sample.

## 2015-07-16 LAB — URINE CULTURE

## 2015-07-20 ENCOUNTER — Other Ambulatory Visit: Payer: Self-pay | Admitting: Family Medicine

## 2015-07-20 NOTE — Telephone Encounter (Signed)
Last TSH was 07/15/14 - ok to refill this medication?

## 2015-07-27 ENCOUNTER — Other Ambulatory Visit (INDEPENDENT_AMBULATORY_CARE_PROVIDER_SITE_OTHER): Payer: BC Managed Care – PPO

## 2015-07-27 DIAGNOSIS — Z0000 Encounter for general adult medical examination without abnormal findings: Secondary | ICD-10-CM | POA: Diagnosis not present

## 2015-07-27 LAB — BASIC METABOLIC PANEL
BUN: 14 mg/dL (ref 6–23)
CALCIUM: 9.5 mg/dL (ref 8.4–10.5)
CO2: 28 meq/L (ref 19–32)
CREATININE: 0.74 mg/dL (ref 0.40–1.20)
Chloride: 103 mEq/L (ref 96–112)
GFR: 84.38 mL/min (ref >60.00)
GLUCOSE: 82 mg/dL (ref 70–99)
Potassium: 4.5 mEq/L (ref 3.5–5.1)
Sodium: 138 mEq/L (ref 135–145)

## 2015-07-27 LAB — CBC WITH DIFFERENTIAL/PLATELET
BASOS PCT: 0.4 % (ref 0.0–3.0)
Basophils Absolute: 0 10*3/uL (ref 0.0–0.1)
EOS ABS: 0.2 10*3/uL (ref 0.0–0.7)
EOS PCT: 3.5 % (ref 0.0–5.0)
HEMATOCRIT: 39.6 % (ref 36.0–46.0)
HEMOGLOBIN: 13.1 g/dL (ref 12.0–15.0)
LYMPHS PCT: 34.3 % (ref 12.0–46.0)
Lymphs Abs: 2.4 10*3/uL (ref 0.7–4.0)
MCHC: 33 g/dL (ref 30.0–36.0)
MCV: 94.2 fl (ref 78.0–100.0)
Monocytes Absolute: 0.7 10*3/uL (ref 0.1–1.0)
Monocytes Relative: 9.3 % (ref 3.0–12.0)
Neutro Abs: 3.7 10*3/uL (ref 1.4–7.7)
Neutrophils Relative %: 52.5 % (ref 43.0–77.0)
Platelets: 237 10*3/uL (ref 150.0–400.0)
RBC: 4.2 Mil/uL (ref 3.87–5.11)
RDW: 13.2 % (ref 11.5–15.5)
WBC: 7 10*3/uL (ref 4.0–10.5)

## 2015-07-27 LAB — POC URINALSYSI DIPSTICK (AUTOMATED)
Bilirubin, UA: NEGATIVE
Glucose, UA: NEGATIVE
KETONES UA: NEGATIVE
LEUKOCYTES UA: NEGATIVE
Nitrite, UA: NEGATIVE
PH UA: 6
PROTEIN UA: NEGATIVE
Urobilinogen, UA: 0.2

## 2015-07-27 LAB — HEPATIC FUNCTION PANEL
ALT: 12 U/L (ref 0–35)
AST: 19 U/L (ref 0–37)
Albumin: 4.3 g/dL (ref 3.5–5.2)
Alkaline Phosphatase: 27 U/L — ABNORMAL LOW (ref 39–117)
BILIRUBIN DIRECT: 0.1 mg/dL (ref 0.0–0.3)
BILIRUBIN TOTAL: 0.4 mg/dL (ref 0.2–1.2)
TOTAL PROTEIN: 7.1 g/dL (ref 6.0–8.3)

## 2015-07-27 LAB — LIPID PANEL
CHOLESTEROL: 159 mg/dL (ref 0–200)
HDL: 45.7 mg/dL (ref >39.00)
LDL Cholesterol: 99 mg/dL (ref 0–99)
NonHDL: 113.02
Total CHOL/HDL Ratio: 3
Triglycerides: 72 mg/dL (ref 0.0–149.0)
VLDL: 14.4 mg/dL (ref 0.0–40.0)

## 2015-07-27 LAB — TSH: TSH: 2.08 u[IU]/mL (ref 0.35–4.50)

## 2015-08-03 ENCOUNTER — Ambulatory Visit (INDEPENDENT_AMBULATORY_CARE_PROVIDER_SITE_OTHER): Payer: BC Managed Care – PPO | Admitting: Family Medicine

## 2015-08-03 ENCOUNTER — Encounter: Payer: Self-pay | Admitting: Family Medicine

## 2015-08-03 VITALS — BP 107/63 | HR 62 | Temp 98.4°F | Ht 63.5 in | Wt 117.0 lb

## 2015-08-03 DIAGNOSIS — Z0000 Encounter for general adult medical examination without abnormal findings: Secondary | ICD-10-CM

## 2015-08-03 NOTE — Progress Notes (Signed)
Pre visit review using our clinic review tool, if applicable. No additional management support is needed unless otherwise documented below in the visit note. 

## 2015-08-03 NOTE — Progress Notes (Signed)
   Subjective:    Patient ID: Kristy [name], female    DOB: 1950/09/29, 65 y.o.   MRN: [id number]  HPI 65 yr old female for a cpx. She feels well. She finally got over the recent UTI she was struggling with.    Review of Systems  Constitutional: Negative.   HENT: Negative.   Eyes: Negative.   Respiratory: Negative.   Cardiovascular: Negative.   Gastrointestinal: Negative.   Genitourinary: Negative for dysuria, urgency, frequency, hematuria, flank pain, decreased urine volume, enuresis, difficulty urinating, pelvic pain and dyspareunia.  Musculoskeletal: Negative.   Skin: Negative.   Neurological: Negative.   Psychiatric/Behavioral: Negative.        Objective:   Physical Exam  Constitutional: She is oriented to person, place, and time. She appears well-developed and well-nourished. No distress.  HENT:  Head: Normocephalic and atraumatic.  Right Ear: External ear normal.  Left Ear: External ear normal.  Nose: Nose normal.  Mouth/Throat: Oropharynx is clear and moist. No oropharyngeal exudate.  Eyes: Conjunctivae and EOM are normal. Pupils are equal, round, and reactive to light. No scleral icterus.  Neck: Normal range of motion. Neck supple. No JVD present. No thyromegaly present.  Cardiovascular: Normal rate, regular rhythm, normal heart sounds and intact distal pulses.  Exam reveals no gallop and no friction rub.   No murmur heard. EKG normal   Pulmonary/Chest: Effort normal and breath sounds normal. No respiratory distress. She has no wheezes. She has no rales. She exhibits no tenderness.  Abdominal: Soft. Bowel sounds are normal. She exhibits no distension and no mass. There is no tenderness. There is no rebound and no guarding.  Musculoskeletal: Normal range of motion. She exhibits no edema or tenderness.  Lymphadenopathy:    She has no cervical adenopathy.  Neurological: She is alert and oriented to person, place, and time. She has normal reflexes. No cranial nerve  deficit. She exhibits normal muscle tone. Coordination normal.  Skin: Skin is warm and dry. No rash noted. No erythema.  Psychiatric: She has a normal mood and affect. Her behavior is normal. Judgment and thought content normal.          Assessment & Plan:  Well exam. We discussed diet and exercise.

## 2015-08-05 ENCOUNTER — Encounter: Payer: Self-pay | Admitting: Family Medicine

## 2015-08-14 ENCOUNTER — Encounter: Payer: Self-pay | Admitting: Family Medicine

## 2015-08-17 ENCOUNTER — Other Ambulatory Visit: Payer: Self-pay | Admitting: Family Medicine

## 2015-08-17 DIAGNOSIS — M810 Age-related osteoporosis without current pathological fracture: Secondary | ICD-10-CM

## 2015-08-17 MED ORDER — LEVOTHYROXINE SODIUM 50 MCG PO TABS: 50.0000 ug | ORAL_TABLET | Freq: Every day | ORAL | Status: DC

## 2015-08-17 MED ORDER — OMEPRAZOLE 20 MG PO CPDR: 20.0000 mg | DELAYED_RELEASE_CAPSULE | Freq: Every day | ORAL | Status: DC

## 2015-08-17 MED ORDER — IBANDRONATE SODIUM 150 MG PO TABS: ORAL_TABLET | ORAL | Status: DC

## 2015-08-17 MED ORDER — SIMVASTATIN 20 MG PO TABS: 20.0000 mg | ORAL_TABLET | Freq: Every day | ORAL | Status: DC

## 2015-08-17 MED ORDER — METOPROLOL SUCCINATE ER 50 MG PO TB24: ORAL_TABLET | ORAL | Status: DC

## 2015-08-17 NOTE — Telephone Encounter (Signed)
Can you make sure pt is scheduled for a bone density at [facility] office? I put in a future order for this.

## 2015-08-17 NOTE — Telephone Encounter (Signed)
I sent in all 5 scripts. Per Dr. Sarajane Jews order a bone density scan. I put this in computer and spoke with pt.

## 2015-08-17 NOTE — Telephone Encounter (Signed)
Pt has been scheduled for bone density!

## 2015-08-20 ENCOUNTER — Other Ambulatory Visit: Payer: Self-pay | Admitting: Family Medicine

## 2015-08-20 ENCOUNTER — Ambulatory Visit (INDEPENDENT_AMBULATORY_CARE_PROVIDER_SITE_OTHER)
Admission: RE | Admit: 2015-08-20 | Discharge: 2015-08-20 | Disposition: A | Discharge status: Home / Self Care | Payer: BC Managed Care – PPO | Source: Ambulatory Visit | Attending: Family Medicine | Admitting: Family Medicine

## 2015-08-20 DIAGNOSIS — M810 Age-related osteoporosis without current pathological fracture: Secondary | ICD-10-CM

## 2015-08-23 DIAGNOSIS — M810 Age-related osteoporosis without current pathological fracture: Secondary | ICD-10-CM | POA: Diagnosis not present

## 2015-08-28 ENCOUNTER — Other Ambulatory Visit: Payer: Self-pay | Admitting: Family Medicine

## 2015-09-08 ENCOUNTER — Other Ambulatory Visit: Payer: Self-pay | Admitting: Obstetrics and Gynecology

## 2015-09-08 ENCOUNTER — Other Ambulatory Visit (HOSPITAL_COMMUNITY)
Admission: RE | Admit: 2015-09-08 | Discharge: 2015-09-08 | Disposition: A | Discharge status: Home / Self Care | Payer: BC Managed Care – PPO | Source: Ambulatory Visit | Attending: Obstetrics and Gynecology | Admitting: Obstetrics and Gynecology

## 2015-09-08 DIAGNOSIS — Z1151 Encounter for screening for human papillomavirus (HPV): Secondary | ICD-10-CM | POA: Insufficient documentation

## 2015-09-08 DIAGNOSIS — Z01419 Encounter for gynecological examination (general) (routine) without abnormal findings: Secondary | ICD-10-CM | POA: Insufficient documentation

## 2015-09-10 LAB — CYTOLOGY - PAP

## 2015-10-28 ENCOUNTER — Other Ambulatory Visit: Payer: Self-pay | Admitting: Family Medicine

## 2015-10-29 NOTE — Telephone Encounter (Signed)
Can we refill this? 

## 2016-02-02 ENCOUNTER — Other Ambulatory Visit: Payer: Self-pay | Admitting: Obstetrics and Gynecology

## 2016-02-02 DIAGNOSIS — Z1231 Encounter for screening mammogram for malignant neoplasm of breast: Secondary | ICD-10-CM

## 2016-03-14 ENCOUNTER — Ambulatory Visit
Admission: RE | Admit: 2016-03-14 | Discharge: 2016-03-14 | Disposition: A | Discharge status: Home / Self Care | Payer: BC Managed Care – PPO | Source: Ambulatory Visit | Attending: Obstetrics and Gynecology | Admitting: Obstetrics and Gynecology

## 2016-03-14 DIAGNOSIS — Z1231 Encounter for screening mammogram for malignant neoplasm of breast: Secondary | ICD-10-CM

## 2016-05-14 IMAGING — MG MM SCREENING BREAST TOMO BILATERAL
8 series · 9 of 24 positions shown · non-contrast
Comparison: Previous exam(s).

CLINICAL DATA: Screening.

EXAM:
DIGITAL SCREENING BILATERAL MAMMOGRAM WITH 3D TOMO WITH CAD

[L MLO]
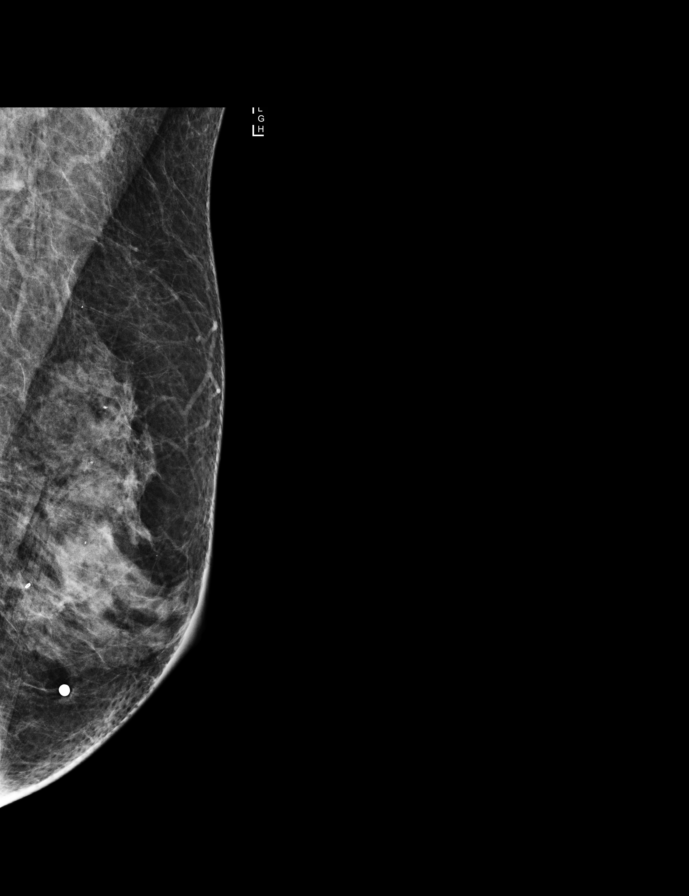

[R CC]
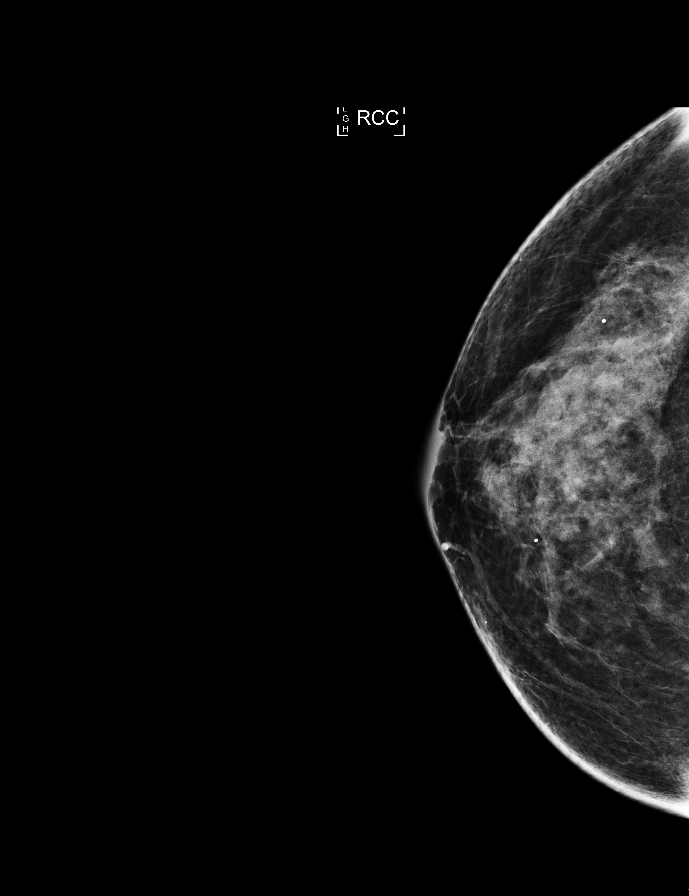

[L CC]
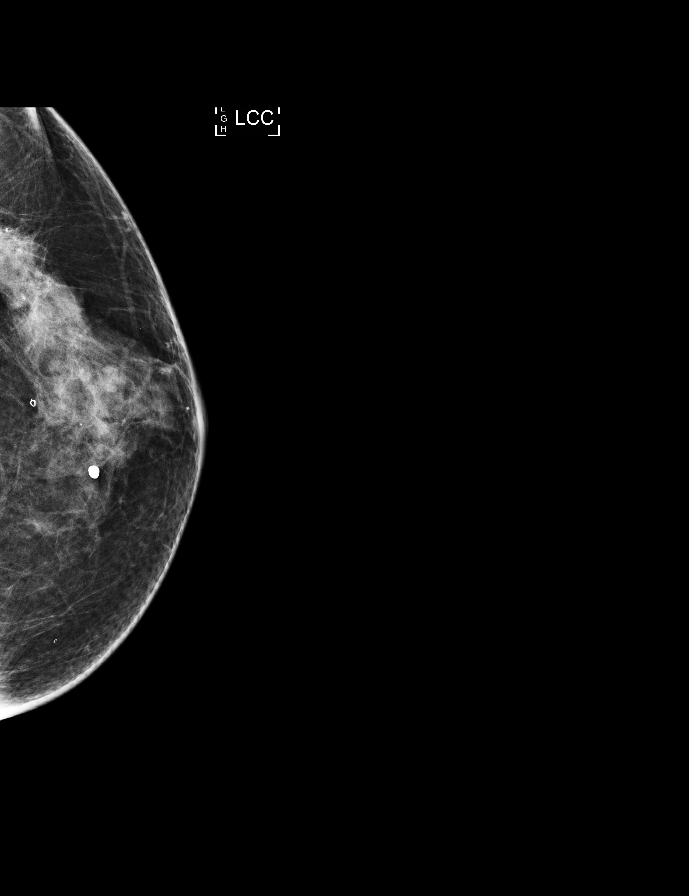

[R MLO]
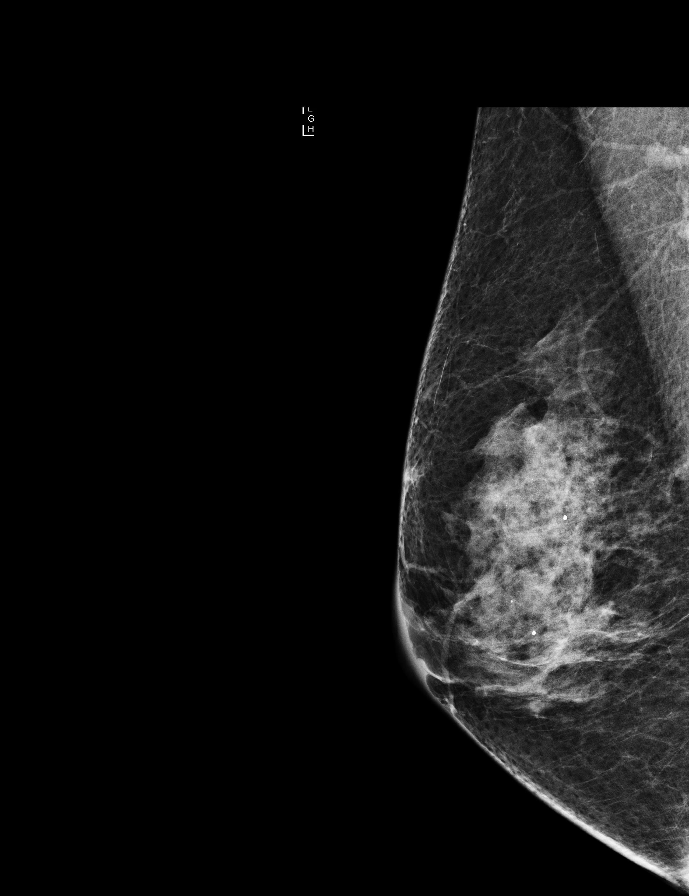

[R MLO tomo · 2 of 62 frames shown]
[frame 21/62]
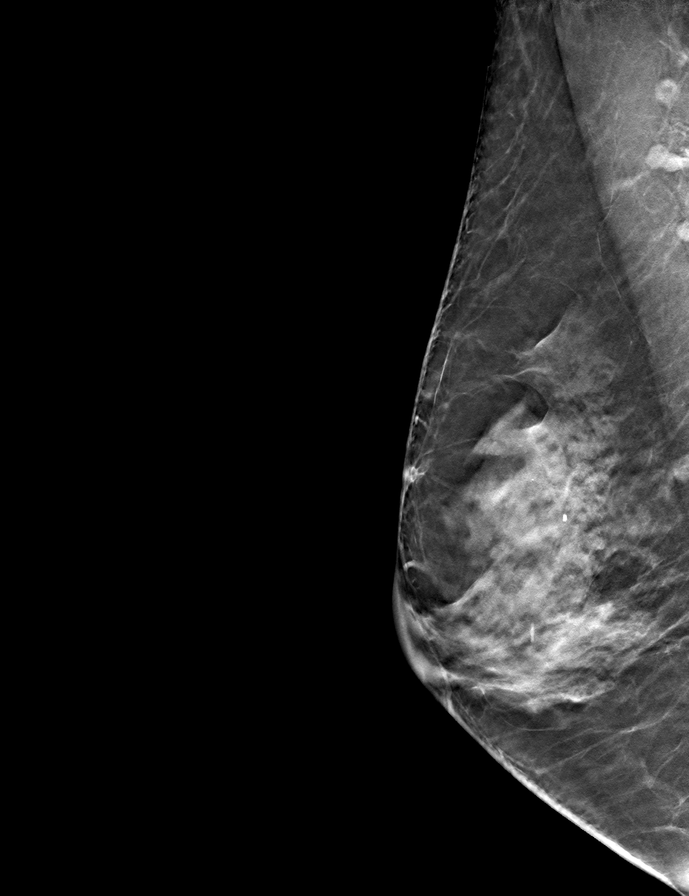
[frame 31/62]
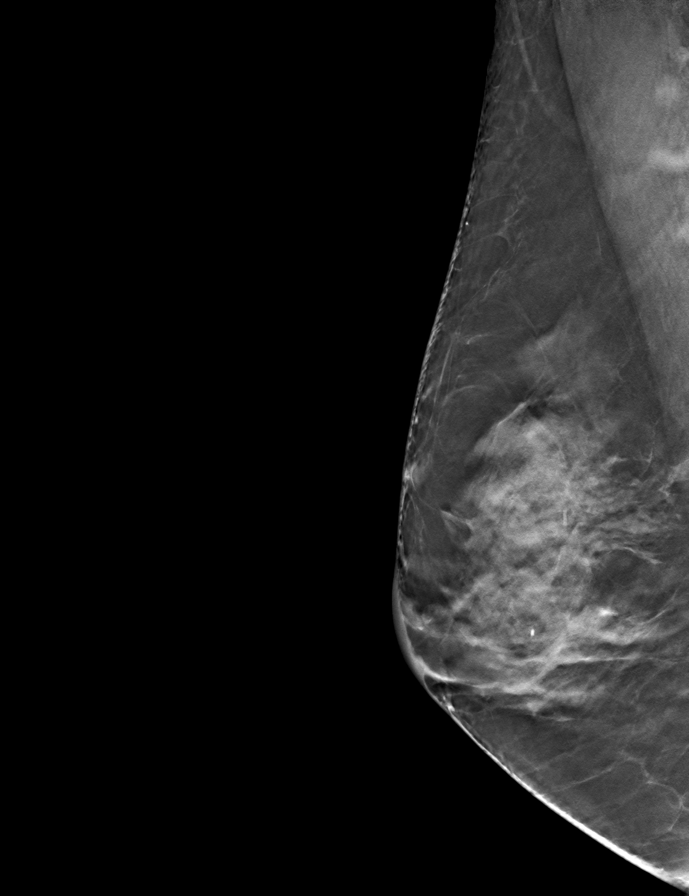

[L MLO tomo · tomo slice 29/58.0]
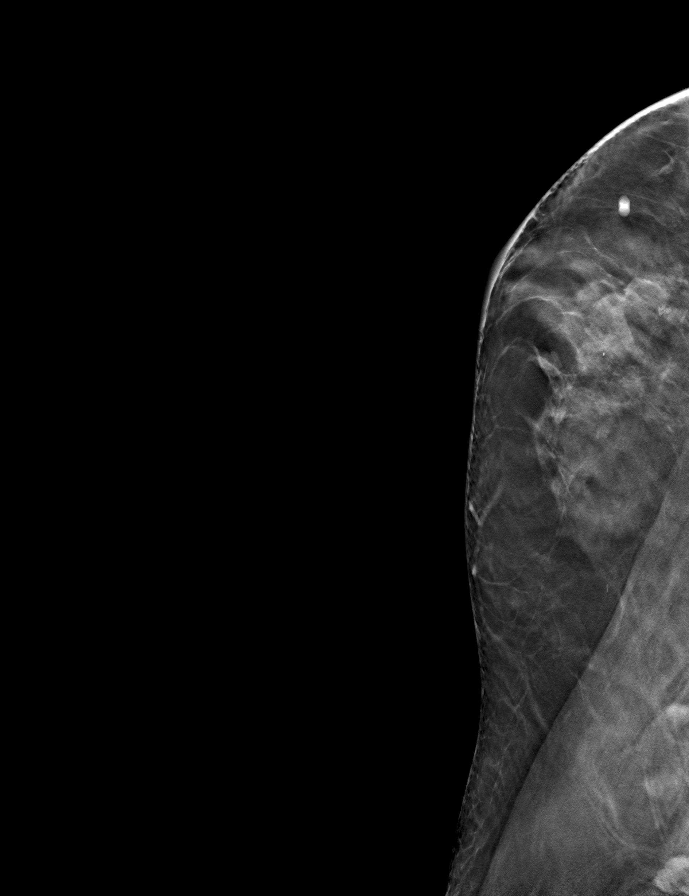

[L CC tomo · tomo slice 33/64.0]
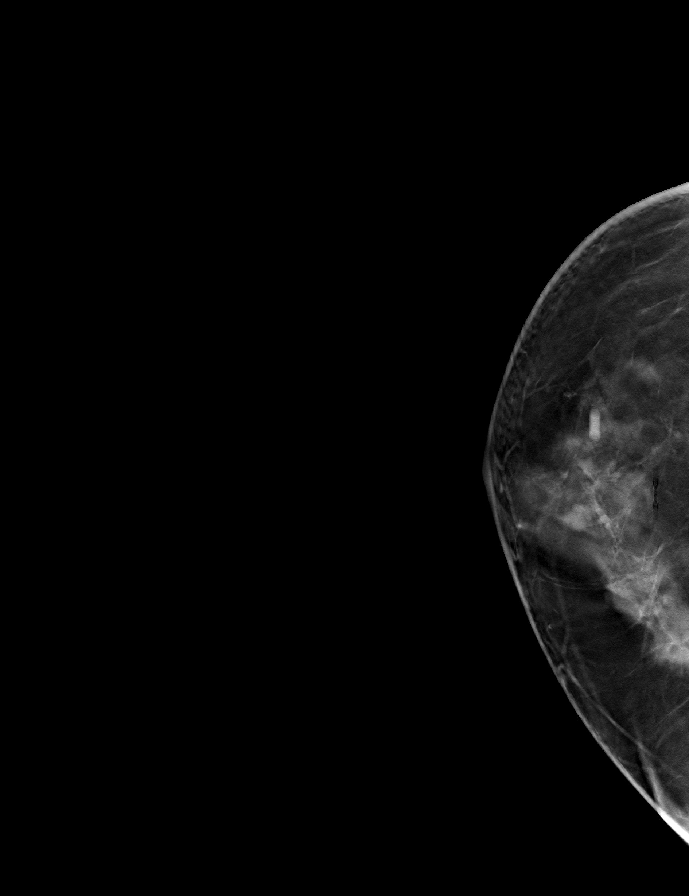

[R CC tomo · tomo slice 34/67.0]
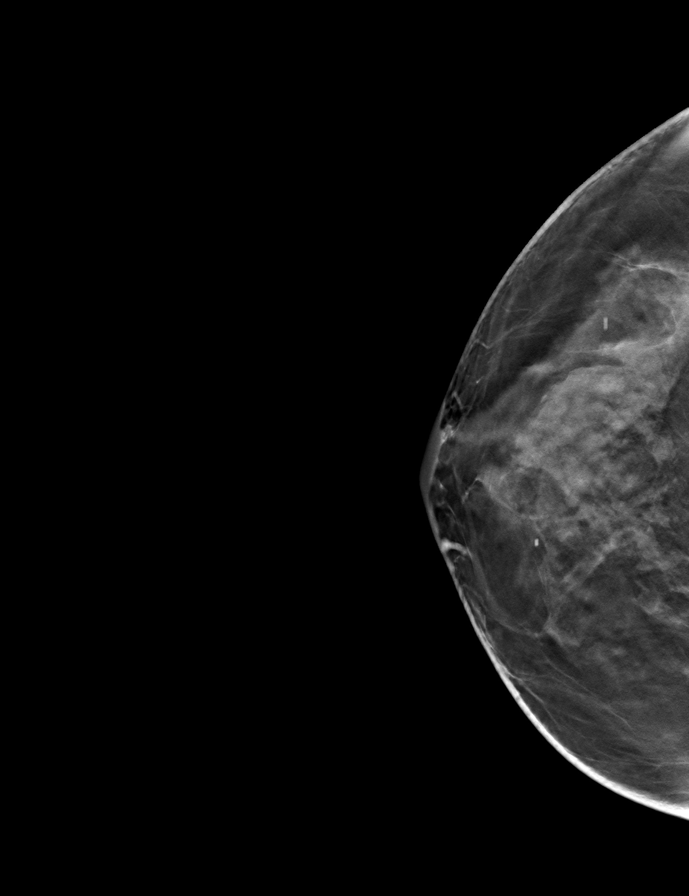

[9 of 24 positions shown; findings below may reference images not displayed]

ACR Breast Density Category c: The breast tissue is heterogeneously
dense, which may obscure small masses.
FINDINGS: There are no findings suspicious for malignancy. Images were
processed with CAD.
IMPRESSION: No mammographic evidence of malignancy. A result letter of this
screening mammogram will be mailed directly to the patient.

RECOMMENDATION:
Screening mammogram in one year. (Code:OA-G-1SS)

BI-RADS CATEGORY  1: Negative.

## 2016-06-23 ENCOUNTER — Encounter: Payer: Self-pay | Admitting: Adult Health

## 2016-06-23 ENCOUNTER — Ambulatory Visit (INDEPENDENT_AMBULATORY_CARE_PROVIDER_SITE_OTHER): Payer: BC Managed Care – PPO | Admitting: Adult Health

## 2016-06-23 VITALS — BP 128/70 | Temp 97.4°F | Ht 63.5 in | Wt 118.6 lb

## 2016-06-23 DIAGNOSIS — N3001 Acute cystitis with hematuria: Secondary | ICD-10-CM | POA: Diagnosis not present

## 2016-06-23 DIAGNOSIS — R350 Frequency of micturition: Secondary | ICD-10-CM | POA: Diagnosis not present

## 2016-06-23 MED ORDER — NITROFURANTOIN MACROCRYSTAL 100 MG PO CAPS: 100.0000 mg | ORAL_CAPSULE | Freq: Two times a day (BID) | ORAL | 0 refills | Status: DC

## 2016-06-23 NOTE — Progress Notes (Signed)
[id number] Sarajane Jews, MD No chief complaint on file.   Current Issues:  Presents with 2 days of dysuria, urinary urgency and urinary frequency Associated symptoms include:  chills, cloudy urine, dysuria, hematuria, urinary frequency and urinary urgency  There is a previous history of of similar symptoms.   Prior to Admission medications   Medication Sig Start Date End Date Taking? Authorizing Provider  calcium-vitamin D (OSCAL WITH D) 500-200 MG-UNIT per tablet Take 1 tablet by mouth daily.     Yes Historical Provider, MD  Cholecalciferol (VITAMIN D-3) 1000 UNITS CAPS Take 1,000 Units by mouth daily.   Yes Historical Provider, MD  fish oil-omega-3 fatty acids 1000 MG capsule Take 1 g by mouth daily.     Yes Historical Provider, MD  fluticasone (FLONASE) 50 MCG/ACT nasal spray Place 1 spray into both nostrils daily.   Yes Historical Provider, MD  ibandronate (BONIVA) 150 MG tablet TAKE 1 TABLET ONCE MONTHLY 10/29/15  Yes Laurey Morale, MD  levothyroxine (SYNTHROID, LEVOTHROID) 50 MCG tablet Take 1 tablet (50 mcg total) by mouth daily. 08/17/15  Yes Laurey Morale, MD  loratadine (CLARITIN) 10 MG tablet Take 10 mg by mouth daily.     Yes Historical Provider, MD  metoprolol succinate (TOPROL-XL) 50 MG 24 hr tablet TAKE 1 TABLET DAILY WITH OR IMMEDIATELY FOLLOWING A MEAL 08/20/15  Yes Laurey Morale, MD  Multiple Vitamin (MULTIVITAMIN) tablet Take 1 tablet by mouth daily.     Yes Historical Provider, MD  omeprazole (PRILOSEC) 20 MG capsule Take 1 capsule (20 mg total) by mouth daily. 08/17/15  Yes Laurey Morale, MD  simvastatin (ZOCOR) 20 MG tablet Take 1 tablet (20 mg total) by mouth at bedtime. 08/17/15  Yes Laurey Morale, MD    Review of Systems:Negative unless mentioned above   PE:  BP 128/70   Temp 97.4 F (36.3 C) (Oral)   Ht 5' 3.5" (1.613 m)   Wt 118 lb 9.6 oz (53.8 kg)   BMI 20.68 kg/m  Constitutional: Alert and oriented. Does not appears in any acute distress Back: No CVA  tenderness Abdomen: No tenderness or masses felt.    Results for orders placed or performed in visit on 09/08/15  Cytology - PAP  Result Value Ref Range   CYTOLOGY - PAP PAP RESULT     Assessment and Plan:    1. Urinary frequency - POCT urinalysis dipstick + Leuks and + blood   2. Acute cystitis with hematuria - Urine culture - nitrofurantoin (MACRODANTIN) 100 MG capsule; Take 1 capsule (100 mg total) by mouth 2 (two) times daily.  Dispense: 10 capsule; Refill: 0 - Tylenol as needed for symptom relief.  - Drink plenty of fluids - Follow up if no improvement   Dorothyann Peng, NP

## 2016-06-24 ENCOUNTER — Other Ambulatory Visit: Payer: Self-pay

## 2016-06-24 ENCOUNTER — Telehealth: Payer: Self-pay | Admitting: Family Medicine

## 2016-06-24 DIAGNOSIS — N3001 Acute cystitis with hematuria: Secondary | ICD-10-CM

## 2016-06-24 LAB — POCT URINALYSIS DIPSTICK
Bilirubin, UA: NEGATIVE
Glucose, UA: NEGATIVE
KETONES UA: NEGATIVE
Nitrite, UA: NEGATIVE
PH UA: 6
Spec Grav, UA: 1.02
UROBILINOGEN UA: NEGATIVE

## 2016-06-24 MED ORDER — NITROFURANTOIN MACROCRYSTAL 100 MG PO CAPS: 100.0000 mg | ORAL_CAPSULE | Freq: Two times a day (BID) | ORAL | 0 refills | Status: AC

## 2016-06-24 NOTE — Telephone Encounter (Signed)
Rx has been sent in to preferred pharmacy and patient has been notified. Thanks!

## 2016-06-24 NOTE — Telephone Encounter (Signed)
[facility]. Pt saw cory last night

## 2016-06-27 LAB — URINE CULTURE

## 2016-07-01 ENCOUNTER — Ambulatory Visit (INDEPENDENT_AMBULATORY_CARE_PROVIDER_SITE_OTHER): Payer: BC Managed Care – PPO | Admitting: Family Medicine

## 2016-07-01 ENCOUNTER — Encounter: Payer: Self-pay | Admitting: Family Medicine

## 2016-07-01 VITALS — BP 129/68 | HR 70 | Temp 98.5°F | Ht 63.5 in | Wt 117.0 lb

## 2016-07-01 DIAGNOSIS — N3000 Acute cystitis without hematuria: Secondary | ICD-10-CM | POA: Diagnosis not present

## 2016-07-01 LAB — POC URINALSYSI DIPSTICK (AUTOMATED)
BILIRUBIN UA: NEGATIVE
Glucose, UA: NEGATIVE
KETONES UA: NEGATIVE
Leukocytes, UA: NEGATIVE
Nitrite, UA: NEGATIVE
PH UA: 6
PROTEIN UA: NEGATIVE
RBC UA: NEGATIVE
Spec Grav, UA: 1.015
Urobilinogen, UA: 0.2

## 2016-07-01 NOTE — Progress Notes (Signed)
   Subjective:    Patient ID: Kristy [name], female    DOB: 04-25-1951, 66 y.o.   MRN: [id number]  HPI Here to recheck a UTI. She will be leaving on a cruise to the [place] in a few days and she wants to make sure she is okay. She was seen on 06-23-16 for UTI symptoms and was given Macrobid. A urine culture showed Macrobid to be a good choice, and in fact all her urinary symptoms have resolved.    Review of Systems  Constitutional: Negative.   Respiratory: Negative.   Cardiovascular: Negative.   Gastrointestinal: Negative.   Genitourinary: Negative.        Objective:   Physical Exam  Constitutional: She is oriented to person, place, and time. She appears well-developed and well-nourished.  Cardiovascular: Normal rate, regular rhythm, normal heart sounds and intact distal pulses.   Pulmonary/Chest: Effort normal and breath sounds normal.  Neurological: She is alert and oriented to person, place, and time.          Assessment & Plan:  Her UTI has resolved. I advised her to drink plenty of water on her cruise. Alysia Penna, MD

## 2016-07-01 NOTE — Progress Notes (Signed)
Pre visit review using our clinic review tool, if applicable. No additional management support is needed unless otherwise documented below in the visit note. 

## 2016-07-11 ENCOUNTER — Encounter: Payer: Self-pay | Admitting: Gastroenterology

## 2016-07-21 ENCOUNTER — Encounter: Payer: Self-pay | Admitting: Family Medicine

## 2016-07-21 ENCOUNTER — Ambulatory Visit (INDEPENDENT_AMBULATORY_CARE_PROVIDER_SITE_OTHER): Payer: BC Managed Care – PPO | Admitting: Family Medicine

## 2016-07-21 VITALS — BP 127/70 | HR 64 | Temp 98.6°F | Ht 63.5 in | Wt 120.0 lb

## 2016-07-21 DIAGNOSIS — N3000 Acute cystitis without hematuria: Secondary | ICD-10-CM

## 2016-07-21 LAB — POC URINALSYSI DIPSTICK (AUTOMATED)
Bilirubin, UA: NEGATIVE
Glucose, UA: NEGATIVE
KETONES UA: NEGATIVE
Leukocytes, UA: NEGATIVE
Nitrite, UA: NEGATIVE
PH UA: 6
PROTEIN UA: NEGATIVE
SPEC GRAV UA: 1.01
Urobilinogen, UA: 0.2

## 2016-07-21 MED ORDER — SULFAMETHOXAZOLE-TRIMETHOPRIM 800-160 MG PO TABS: 1.0000 | ORAL_TABLET | Freq: Two times a day (BID) | ORAL | 0 refills | Status: DC

## 2016-07-21 NOTE — Progress Notes (Signed)
   Subjective:    Patient ID: Kristy [name], female    DOB: 1951/04/03, 66 y.o.   MRN: [id number]  HPI Here for the onset yesterday of urgency to urinate and burning. She started drinking a lot of water and cranberry juice and today she feels back to normal. No fever or back pain. She was treated last month for an E coli UTI with Macrodantin.    Review of Systems  Constitutional: Negative.   Respiratory: Negative.   Cardiovascular: Negative.   Gastrointestinal: Negative.   Genitourinary: Positive for dysuria, frequency and urgency. Negative for flank pain and hematuria.       Objective:   Physical Exam  Constitutional: She is oriented to person, place, and time. She appears well-developed and well-nourished.  Cardiovascular: Normal rate, regular rhythm, normal heart sounds and intact distal pulses.   Pulmonary/Chest: Effort normal and breath sounds normal.  Abdominal: Soft. Bowel sounds are normal. She exhibits no distension and no mass. There is no tenderness. There is no rebound and no guarding.  Neurological: She is alert and oriented to person, place, and time.          Assessment & Plan:  Possible UTI. We will send the sample for a culture. I gave her a rx for Bactrim DS to take home. She will start this over the coming weekend if she feels the UTI symptoms returning.  Alysia Penna, MD

## 2016-07-21 NOTE — Addendum Note (Signed)
Addended by: Aggie Hacker A on: 07/21/2016 11:48 AM   Modules accepted: Orders

## 2016-07-21 NOTE — Progress Notes (Signed)
Pre visit review using our clinic review tool, if applicable. No additional management support is needed unless otherwise documented below in the visit note. 

## 2016-07-22 LAB — URINE CULTURE: Organism ID, Bacteria: NO GROWTH

## 2016-08-29 ENCOUNTER — Other Ambulatory Visit (INDEPENDENT_AMBULATORY_CARE_PROVIDER_SITE_OTHER): Payer: BC Managed Care – PPO

## 2016-08-29 DIAGNOSIS — Z0000 Encounter for general adult medical examination without abnormal findings: Secondary | ICD-10-CM

## 2016-08-29 LAB — POC URINALSYSI DIPSTICK (AUTOMATED)
BILIRUBIN UA: NEGATIVE
Blood, UA: NEGATIVE
Glucose, UA: NEGATIVE
KETONES UA: NEGATIVE
LEUKOCYTES UA: NEGATIVE
NITRITE UA: NEGATIVE
PH UA: 6.5
Protein, UA: NEGATIVE
Spec Grav, UA: 1.01
Urobilinogen, UA: 0.2

## 2016-08-29 LAB — CBC WITH DIFFERENTIAL/PLATELET
BASOS ABS: 0 10*3/uL (ref 0.0–0.1)
Basophils Relative: 0.6 % (ref 0.0–3.0)
EOS ABS: 0.2 10*3/uL (ref 0.0–0.7)
Eosinophils Relative: 3.7 % (ref 0.0–5.0)
HEMATOCRIT: 38.9 % (ref 36.0–46.0)
Hemoglobin: 13.3 g/dL (ref 12.0–15.0)
LYMPHS ABS: 1.8 10*3/uL (ref 0.7–4.0)
LYMPHS PCT: 36.5 % (ref 12.0–46.0)
MCHC: 34.1 g/dL (ref 30.0–36.0)
MCV: 94 fl (ref 78.0–100.0)
MONOS PCT: 8.4 % (ref 3.0–12.0)
Monocytes Absolute: 0.4 10*3/uL (ref 0.1–1.0)
NEUTROS PCT: 50.8 % (ref 43.0–77.0)
Neutro Abs: 2.6 10*3/uL (ref 1.4–7.7)
PLATELETS: 237 10*3/uL (ref 150.0–400.0)
RBC: 4.14 Mil/uL (ref 3.87–5.11)
RDW: 13 % (ref 11.5–15.5)
WBC: 5 10*3/uL (ref 4.0–10.5)

## 2016-08-29 LAB — HEPATIC FUNCTION PANEL
ALBUMIN: 4.3 g/dL (ref 3.5–5.2)
ALK PHOS: 27 U/L — AB (ref 39–117)
ALT: 13 U/L (ref 0–35)
AST: 19 U/L (ref 0–37)
BILIRUBIN DIRECT: 0.1 mg/dL (ref 0.0–0.3)
Total Bilirubin: 0.6 mg/dL (ref 0.2–1.2)
Total Protein: 6.7 g/dL (ref 6.0–8.3)

## 2016-08-29 LAB — BASIC METABOLIC PANEL
BUN: 12 mg/dL (ref 6–23)
CALCIUM: 9.1 mg/dL (ref 8.4–10.5)
CO2: 25 mEq/L (ref 19–32)
CREATININE: 0.66 mg/dL (ref 0.40–1.20)
Chloride: 99 mEq/L (ref 96–112)
GFR: 95.95 mL/min (ref >60.00)
Glucose, Bld: 81 mg/dL (ref 70–99)
Potassium: 3.8 mEq/L (ref 3.5–5.1)
SODIUM: 134 meq/L — AB (ref 135–145)

## 2016-08-29 LAB — TSH: TSH: 2.04 u[IU]/mL (ref 0.35–4.50)

## 2016-08-29 LAB — LIPID PANEL
CHOL/HDL RATIO: 3
Cholesterol: 176 mg/dL (ref 0–200)
HDL: 55.9 mg/dL (ref >39.00)
LDL CALC: 109 mg/dL — AB (ref 0–99)
NONHDL: 120.36
TRIGLYCERIDES: 55 mg/dL (ref 0.0–149.0)
VLDL: 11 mg/dL (ref 0.0–40.0)

## 2016-09-05 ENCOUNTER — Ambulatory Visit (INDEPENDENT_AMBULATORY_CARE_PROVIDER_SITE_OTHER): Payer: BC Managed Care – PPO | Admitting: Family Medicine

## 2016-09-05 ENCOUNTER — Encounter: Payer: Self-pay | Admitting: Family Medicine

## 2016-09-05 VITALS — BP 100/62 | HR 69 | Temp 97.8°F | Ht 63.5 in | Wt 119.0 lb

## 2016-09-05 DIAGNOSIS — Z0000 Encounter for general adult medical examination without abnormal findings: Secondary | ICD-10-CM | POA: Diagnosis not present

## 2016-09-05 MED ORDER — SIMVASTATIN 20 MG PO TABS: 20.0000 mg | ORAL_TABLET | Freq: Every day | ORAL | 3 refills | Status: DC

## 2016-09-05 MED ORDER — IBANDRONATE SODIUM 150 MG PO TABS: ORAL_TABLET | ORAL | 3 refills | Status: DC

## 2016-09-05 MED ORDER — LEVOTHYROXINE SODIUM 50 MCG PO TABS: 50.0000 ug | ORAL_TABLET | Freq: Every day | ORAL | 3 refills | Status: DC

## 2016-09-05 MED ORDER — METOPROLOL SUCCINATE ER 50 MG PO TB24: ORAL_TABLET | ORAL | 3 refills | Status: DC

## 2016-09-05 MED ORDER — OMEPRAZOLE 20 MG PO CPDR: 20.0000 mg | DELAYED_RELEASE_CAPSULE | Freq: Every day | ORAL | 3 refills | Status: DC

## 2016-09-05 NOTE — Patient Instructions (Signed)
WE NOW OFFER   [facility] FAST TRACK!!!  SAME DAY Appointments for ACUTE CARE  Such as: Sprains, Injuries, cuts, abrasions, rashes, muscle pain, joint pain, back pain Colds, flu, sore throats, headache, allergies, cough, fever  Ear pain, sinus and eye infections Abdominal pain, nausea, vomiting, diarrhea, upset stomach Animal/insect bites  3 Easy Ways to Schedule: Walk-In Scheduling Call in scheduling Mychart Sign-up: [email or web address]         

## 2016-09-05 NOTE — Progress Notes (Signed)
   Subjective:    Patient ID: Kristy [name], female    DOB: 1950/11/18, 66 y.o.   MRN: [id number]  HPI 66 yr old female for a well exam. She feels great.    Review of Systems  Constitutional: Negative.   HENT: Negative.   Eyes: Negative.   Respiratory: Negative.   Cardiovascular: Negative.   Gastrointestinal: Negative.   Genitourinary: Negative for decreased urine volume, difficulty urinating, dyspareunia, dysuria, enuresis, flank pain, frequency, hematuria, pelvic pain and urgency.  Musculoskeletal: Negative.   Skin: Negative.   Neurological: Negative.   Psychiatric/Behavioral: Negative.        Objective:   Physical Exam  Constitutional: She is oriented to person, place, and time. She appears well-developed and well-nourished. No distress.  HENT:  Head: Normocephalic and atraumatic.  Right Ear: External ear normal.  Left Ear: External ear normal.  Nose: Nose normal.  Mouth/Throat: Oropharynx is clear and moist. No oropharyngeal exudate.  Eyes: Conjunctivae and EOM are normal. Pupils are equal, round, and reactive to light. No scleral icterus.  Neck: Normal range of motion. Neck supple. No JVD present. No thyromegaly present.  Cardiovascular: Normal rate, regular rhythm, normal heart sounds and intact distal pulses.  Exam reveals no gallop and no friction rub.   No murmur heard. Pulmonary/Chest: Effort normal and breath sounds normal. No respiratory distress. She has no wheezes. She has no rales. She exhibits no tenderness.  Abdominal: Soft. Bowel sounds are normal. She exhibits no distension and no mass. There is no tenderness. There is no rebound and no guarding.  Musculoskeletal: Normal range of motion. She exhibits no edema or tenderness.  Lymphadenopathy:    She has no cervical adenopathy.  Neurological: She is alert and oriented to person, place, and time. She has normal reflexes. No cranial nerve deficit. She exhibits normal muscle tone. Coordination normal.  Skin:  Skin is warm and dry. No rash noted. No erythema.  Psychiatric: She has a normal mood and affect. Her behavior is normal. Judgment and thought content normal.          Assessment & Plan:  Well exam. We discussed diet and exercise.  Alysia Penna, MD

## 2016-09-05 NOTE — Progress Notes (Signed)
Pre visit review using our clinic review tool, if applicable. No additional management support is needed unless otherwise documented below in the visit note. 

## 2016-10-12 ENCOUNTER — Encounter: Payer: Self-pay | Admitting: Gastroenterology

## 2016-11-18 ENCOUNTER — Ambulatory Visit: Payer: BC Managed Care – PPO

## 2016-11-18 VITALS — Ht 63.5 in | Wt 120.4 lb

## 2016-11-18 DIAGNOSIS — Z860100 Personal history of colon polyps, unspecified: Secondary | ICD-10-CM

## 2016-11-18 MED ORDER — SUPREP BOWEL PREP KIT 17.5-3.13-1.6 GM/177ML PO SOLN: 1.0000 | Freq: Once | ORAL | 0 refills | Status: AC

## 2016-11-18 NOTE — Progress Notes (Signed)
No diet meds No allergies to eggs or soy No home oxygen No past problems with anesthesai  Declined emmi

## 2016-11-22 ENCOUNTER — Encounter: Payer: Self-pay | Admitting: Gastroenterology

## 2016-12-05 ENCOUNTER — Ambulatory Visit (AMBULATORY_SURGERY_CENTER): Payer: BC Managed Care – PPO | Admitting: Gastroenterology

## 2016-12-05 ENCOUNTER — Encounter: Payer: Self-pay | Admitting: Gastroenterology

## 2016-12-05 VITALS — BP 99/57 | HR 58 | Temp 98.9°F | Resp 11 | Ht 63.0 in | Wt 120.0 lb

## 2016-12-05 DIAGNOSIS — Z86010 Personal history of colonic polyps: Secondary | ICD-10-CM | POA: Diagnosis not present

## 2016-12-05 DIAGNOSIS — K5730 Diverticulosis of large intestine without perforation or abscess without bleeding: Secondary | ICD-10-CM | POA: Diagnosis not present

## 2016-12-05 DIAGNOSIS — Z800 Family history of malignant neoplasm of digestive organs: Secondary | ICD-10-CM

## 2016-12-05 HISTORY — PX: COLONOSCOPY: SHX174

## 2016-12-05 MED ORDER — SODIUM CHLORIDE 0.9 % IV SOLN: 500.0000 mL | INTRAVENOUS | Status: DC

## 2016-12-05 NOTE — Progress Notes (Signed)
Report given to PACU, vss 

## 2016-12-05 NOTE — Op Note (Signed)
[facility] Patient Name: Kristy [name] Procedure Date: 12/05/2016 1:42 PM MRN: [id number] Endoscopist: Milus Banister , MD Age: 66 Referring MD:  Date of Birth: 09-02-1950 Gender: Female Account #: [id number] Procedure:                Colonoscopy Indications:              High risk colon cancer surveillance: Personal                            history of colonic polyps (colonoscopy 2013 1 subCM                            adenoma), Family history of colon cancer in a                            first-degree relative (mother) Medicines:                Monitored Anesthesia Care Procedure:                Pre-Anesthesia Assessment:                           - Prior to the procedure, a History and Physical                            was performed, and patient medications and                            allergies were reviewed. The patient's tolerance of                            previous anesthesia was also reviewed. The risks                            and benefits of the procedure and the sedation                            options and risks were discussed with the patient.                            All questions were answered, and informed consent                            was obtained. Prior Anticoagulants: The patient has                            taken no previous anticoagulant or antiplatelet                            agents. ASA Grade Assessment: II - A patient with                            mild systemic disease. After reviewing the risks  and benefits, the patient was deemed in                            satisfactory condition to undergo the procedure.                           After obtaining informed consent, the colonoscope                            was passed under direct vision. Throughout the                            procedure, the patient's blood pressure, pulse, and                            oxygen saturations were monitored  continuously. The                            Colonoscope was introduced through the anus and                            advanced to the the cecum, identified by                            appendiceal orifice and ileocecal valve. The                            colonoscopy was performed without difficulty. The                            patient tolerated the procedure well. The quality                            of the bowel preparation was good. The ileocecal                            valve, appendiceal orifice, and rectum were                            photographed. Scope In: 1:48:41 PM Scope Out: 1:57:16 PM Scope Withdrawal Time: 0 hours 6 minutes 3 seconds  Total Procedure Duration: 0 hours 8 minutes 35 seconds  Findings:                 Multiple small-mouthed diverticula were found in                            the left colon.                           The exam was otherwise without abnormality on                            direct and retroflexion views. Complications:            No immediate complications. Estimated blood  loss:                            None. Estimated Blood Loss:     Estimated blood loss: none. Impression:               - Diverticulosis in the left colon.                           - The examination was otherwise normal on direct                            and retroflexion views.                           - No polyps or cancers Recommendation:           - Patient has a contact number available for                            emergencies. The signs and symptoms of potential                            delayed complications were discussed with the                            patient. Return to normal activities tomorrow.                            Written discharge instructions were provided to the                            patient.                           - Resume previous diet.                           - Continue present medications.                            - Repeat colonoscopy in 5 years for screening                            purposes. Milus Banister, MD 12/05/2016 2:00:17 PM This report has been signed electronically.

## 2016-12-05 NOTE — Patient Instructions (Signed)
YOU HAD AN ENDOSCOPIC PROCEDURE TODAY AT THE [facility]:   Refer to the procedure report that was given to you for any specific questions about what was found during the examination.  If the procedure report does not answer your questions, please call your gastroenterologist to clarify.  If you requested that your care partner not be given the details of your procedure findings, then the procedure report has been included in a sealed envelope for you to review at your convenience later.  YOU SHOULD EXPECT: Some feelings of bloating in the abdomen. Passage of more gas than usual.  Walking can help get rid of the air that was put into your GI tract during the procedure and reduce the bloating. If you had a lower endoscopy (such as a colonoscopy or flexible sigmoidoscopy) you may notice spotting of blood in your stool or on the toilet paper. If you underwent a bowel prep for your procedure, you may not have a normal bowel movement for a few days.  Please Note:  You might notice some irritation and congestion in your nose or some drainage.  This is from the oxygen used during your procedure.  There is no need for concern and it should clear up in a day or so.  SYMPTOMS TO REPORT IMMEDIATELY:   Following lower endoscopy (colonoscopy or flexible sigmoidoscopy):  Excessive amounts of blood in the stool  Significant tenderness or worsening of abdominal pains  Swelling of the abdomen that is new, acute  Fever of 100F or higher   For urgent or emergent issues, a gastroenterologist can be reached at any hour by calling [phone]. Please read all handouts given to you by your recovery nurse.   DIET:  We do recommend a small meal at first, but then you may proceed to your regular diet.  Drink plenty of fluids but you should avoid alcoholic beverages for 24 hours.  ACTIVITY:  You should plan to take it easy for the rest of today and you should NOT DRIVE or use heavy machinery until  tomorrow (because of the sedation medicines used during the test).    FOLLOW UP: Our staff will call the number listed on your records the next business day following your procedure to check on you and address any questions or concerns that you may have regarding the information given to you following your procedure. If we do not reach you, we will leave a message.  However, if you are feeling well and you are not experiencing any problems, there is no need to return our call.  We will assume that you have returned to your regular daily activities without incident.  If any biopsies were taken you will be contacted by phone or by letter within the next 1-3 weeks.  Please call us at [phone] if you have not heard about the biopsies in 3 weeks.    SIGNATURES/CONFIDENTIALITY: You and/or your care partner have signed paperwork which will be entered into your electronic medical record.  These signatures attest to the fact that that the information above on your After Visit Summary has been reviewed and is understood.  Full responsibility of the confidentiality of this discharge information lies with you and/or your care-partner.  Thank you for letting us take care of your healthcare needs today. 

## 2016-12-05 NOTE — Progress Notes (Signed)
Pt's states no medical or surgical changes since previsit or office visit. 

## 2016-12-06 ENCOUNTER — Telehealth: Payer: Self-pay | Admitting: *Deleted

## 2016-12-06 NOTE — Telephone Encounter (Signed)
  Follow up Call-  Call back number 12/05/2016  Post procedure Call Back phone  # [phone]  Permission to leave phone message Yes  Some recent data might be hidden     Patient questions:  Do you have a fever, pain , or abdominal swelling? No. Pain Score  0 *  Have you tolerated food without any problems? Yes.    Have you been able to return to your normal activities? Yes.    Do you have any questions about your discharge instructions: Diet   No. Medications  No. Follow up visit  No.  Do you have questions or concerns about your Care? No.  Actions: * If pain score is 4 or above: No action needed, pain <4.

## 2017-02-01 ENCOUNTER — Other Ambulatory Visit: Payer: Self-pay | Admitting: Obstetrics and Gynecology

## 2017-02-01 DIAGNOSIS — Z1231 Encounter for screening mammogram for malignant neoplasm of breast: Secondary | ICD-10-CM

## 2017-03-16 ENCOUNTER — Encounter: Payer: Self-pay | Admitting: Family Medicine

## 2017-03-17 ENCOUNTER — Ambulatory Visit
Admission: RE | Admit: 2017-03-17 | Discharge: 2017-03-17 | Disposition: A | Discharge status: Home / Self Care | Payer: BC Managed Care – PPO | Source: Ambulatory Visit | Attending: Obstetrics and Gynecology | Admitting: Obstetrics and Gynecology

## 2017-03-17 DIAGNOSIS — Z1231 Encounter for screening mammogram for malignant neoplasm of breast: Secondary | ICD-10-CM

## 2017-03-17 HISTORY — DX: Personal history of irradiation: Z92.3

## 2017-08-28 ENCOUNTER — Encounter: Payer: Self-pay | Admitting: Cardiology

## 2017-09-01 ENCOUNTER — Telehealth: Payer: Self-pay | Admitting: Family Medicine

## 2017-09-01 MED ORDER — LEVOTHYROXINE SODIUM 50 MCG PO TABS: 50.0000 ug | ORAL_TABLET | Freq: Every day | ORAL | 1 refills | Status: DC

## 2017-09-01 NOTE — Telephone Encounter (Signed)
LOV 09/05/16 with Dr. Sarajane Jews / Future appointment on 09/13/17 with dr. Sarajane Jews / Last noted on 09-05-16 / Will refill per protocol /

## 2017-09-01 NOTE — Addendum Note (Signed)
Addended by: Kendrick Ranch on: 09/01/2017 03:39 PM   Modules accepted: Orders

## 2017-09-01 NOTE — Telephone Encounter (Signed)
Medication refilled for a month's supply because patient has an upcoming appointment.  Labs may be checked and medication may need adjusting.  Patient can have a year supply after appointment.

## 2017-09-01 NOTE — Telephone Encounter (Signed)
Copied from [facility]. Topic: Quick Communication - Rx Refill/Question >> Sep 01, 2017  3:14 PM Oliver Pila B wrote: Medication:  levothyroxine (SYNTHROID, LEVOTHROID) 50 MCG tablet [[id number]]   Has the patient contacted their pharmacy? Yes.     (Agent: If no, request that the patient contact the pharmacy for the refill.)   Preferred Pharmacy (with phone number or street name): [facility] [address]   Agent: Please be advised that RX refills may take up to 3 business days. We ask that you follow-up with your pharmacy.

## 2017-09-04 ENCOUNTER — Encounter: Payer: Self-pay | Admitting: Cardiology

## 2017-09-04 ENCOUNTER — Ambulatory Visit: Payer: BC Managed Care – PPO | Admitting: Cardiology

## 2017-09-04 VITALS — BP 110/64 | HR 61 | Ht 63.0 in | Wt 119.0 lb

## 2017-09-04 DIAGNOSIS — R002 Palpitations: Secondary | ICD-10-CM | POA: Diagnosis not present

## 2017-09-04 DIAGNOSIS — R0789 Other chest pain: Secondary | ICD-10-CM | POA: Diagnosis not present

## 2017-09-04 DIAGNOSIS — E7800 Pure hypercholesterolemia, unspecified: Secondary | ICD-10-CM | POA: Diagnosis not present

## 2017-09-04 NOTE — Progress Notes (Signed)
[facility]. [address], [address] [place], [place]  [address] Phone: [phone] Fax:  [phone]  Date:  09/04/2017   ID:  Kristy [name], DOB June 20, 1951, MRN [id number]  PCP:  Laurey Morale, MD   History of Present Illness: Kristy [name] is a 67 y.o. female here for evaluations of palpitations. Reassuring workup with echocardiogram and exercise treadmill test in December 2014 normal.   Back in 06/2012 had [place] palpitations at the beach. Like a "flutter". Over a few months they lessened. In September, came back. Started metoprolol. Then came back again, a little bit. Went up to 50mg  of metoprolol. No longer flutters but having thumping like a baby kicking. Maybe cold or lifting previously effected. Seemed to happen more with lifting. Off caffeine. They are increasing in frequency and are sometimes lasting longer durations. At times, they can last one hour in duration. They feel like a pounding. They do not seem to be prolonged racing episodes. Denies any syncope, angina, chest pain, shortness of breath. However, she does state that when walking up [place], she can feel shortness of breath which has been going on for quite some time.  She has had experience of vasovagal syncope during blood draws or procedures. She's currently taking medication for hyperlipidemia. 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 She has a history of left breast lumpectomy, intraductal.  Father had 3 MI's in his 43's. [facility]. ALS.      07/23/14 - was doing well until mid December. Started having fluttering feeling left flank/ breast. Had radiation prior. When arm is certain position feels a twitch. ? Heart she states. Stressful fall, mother died in 2023/03/16. Family pressures. Sister with new boyfriend. Now feels better. Atypical CP resolved with eating.   09/04/17 - on 3/1 felt fluttering weird feelings all day for 2 days. Went away after 3 days. GERD like discomfort. At night.  Eating earlier. Light pain.   Wt Readings from Last 3 Encounters:  09/04/17 119 lb (54 kg)  12/05/16 120 lb (54.4 kg)  11/18/16 120 lb 6.4 oz (54.6 kg)     Past Medical History:  Diagnosis Date  . Allergy   . Blood in stool   . Cancer ([place]) 1998   breast  . GERD (gastroesophageal reflux disease)   . Gynecologic exam normal    sees Dr. Christophe Louis  . Hyperlipidemia   . Hypothyroidism   . Osteoporosis    last DEXA 07-18-12  . Personal history of radiation therapy   . Posterior vitreous detachment of left eye    sees Dr. Starling Manns     Past Surgical History:  Procedure Laterality Date  . BREAST LUMPECTOMY  1998   left  . COLONOSCOPY  08-26-11   per Dr. Ardis Hughs, polyps, repeat in 5 yrs   . TONSILLECTOMY  1959    Current Outpatient Medications  Medication Sig Dispense Refill  . calcium-vitamin D (OSCAL WITH D) 500-200 MG-UNIT per tablet Take 1 tablet by mouth daily.      . Cholecalciferol (VITAMIN D-3) 1000 UNITS CAPS Take 1,000 Units by mouth daily.    . fish oil-omega-3 fatty acids 1000 MG capsule Take 1 g by mouth daily.      . fluticasone (FLONASE) 50 MCG/ACT nasal spray Place 1 spray into both nostrils daily.    Marland Kitchen ibandronate (BONIVA) 150 MG tablet TAKE 1 TABLET ONCE MONTHLY 3 tablet 3  . levothyroxine (SYNTHROID, LEVOTHROID) 50  MCG tablet Take 1 tablet (50 mcg total) by mouth daily. 30 tablet 1  . loratadine (CLARITIN) 10 MG tablet Take 10 mg by mouth daily.      . metoprolol succinate (TOPROL-XL) 50 MG 24 hr tablet TAKE 1 TABLET DAILY WITH OR IMMEDIATELY FOLLOWING A MEAL 90 tablet 3  . Multiple Vitamin (MULTIVITAMIN) tablet Take 1 tablet by mouth daily.      Marland Kitchen omeprazole (PRILOSEC) 20 MG capsule Take 1 capsule (20 mg total) by mouth daily. 90 capsule 3  . simvastatin (ZOCOR) 20 MG tablet Take 1 tablet (20 mg total) by mouth at bedtime. 90 tablet 3   Current Facility-Administered Medications  Medication Dose Route Frequency Provider Last Rate Last Dose  . 0.9 %  sodium  chloride infusion  500 mL Intravenous Continuous Milus Banister, MD        Allergies:    Allergies  Allergen Reactions  . Ciprofloxacin     Diarrhea   . Levofloxacin     REACTION: neuralgia    Social History:  The patient  reports that  has never smoked. she has never used smokeless tobacco. She reports that she does not drink alcohol or use drugs.   Family History  Problem Relation Age of Onset  . Colon cancer Mother 59  . Hypertension Mother   . Cancer Mother   . Colon cancer Paternal Aunt 78  . Heart disease Father   . Heart attack Father   . Hypertension Father   . ALS Unknown        fhx  . Arthritis Unknown        fhx  . Hyperlipidemia Unknown        fhx  . Hypertension Unknown        fhx  . Mental illness Unknown        fhx  . Heart disease Unknown        fhx  . Stomach cancer Neg Hx   . Breast cancer Neg Hx     ROS:  Please see the history of present illness.   All others ROS Neg   PHYSICAL EXAM: VS:  BP 110/64   Pulse 61   Ht 5\' 3"  (1.6 m)   Wt 119 lb (54 kg)   BMI 21.08 kg/m  GEN: Well nourished, well developed, in no acute distress  HEENT: normal  Neck: no JVD, carotid bruits, or masses Cardiac: RRR; no murmurs, rubs, or gallops,no edema  Respiratory:  clear to auscultation bilaterally, normal work of breathing GI: soft, nontender, nondistended, + BS MS: no deformity or atrophy  Skin: warm and dry, no rash Neuro:  Alert and Oriented x 3, Strength and sensation are intact Psych: euthymic mood, full affect   TSH 2.78 LDL 81 LFTs normal Hemoglobin 13.5  07/13/12-48 hour Holter monitor-occasional PACs, 6 beats NSVT at 4 AM, asymptomatic  EKG: 09/04/17 - NSR, NSSTW abnormality. 07/23/14 - NSR 66, no other changes.  Normal rhythm, 71, no other abnormalities, normal QT  Echocardiogram: 06/19/13-normal ejection fraction  ASSESSMENT AND PLAN:   Palpitations -Similar to how she was feeling over 3 years ago, intermittent.  Sometimes she feels  them after getting out of the shower.  A previous monitor in 2014 showed rare PVCs/PACs.  Interestingly, there was a 6 beat run of what appeared to be nonsustained ventricular tachycardia and a very slow heart rate of 158 at 4 AM    asymptomatic.  She was placed on beta-blocker.  She has not  had any high risk symptoms such as syncope.  She will continue to watch her caffeine use, anxiety.  Thyroid reassuring.   Atypical CP  - intermittent random better with food, water, sounds like GI. Reassurance.  Obviously if things change or becomes more exertional, we will pursue further testing.  Exercise treadmill test showed no signs of ischemia.  Hyperlipidemia  - statin  One-year follow-up   Signed, Candee Furbish, MD [facility]  09/04/2017 3:44 PM

## 2017-09-04 NOTE — Patient Instructions (Signed)

## 2017-09-13 ENCOUNTER — Encounter: Payer: Self-pay | Admitting: Family Medicine

## 2017-09-13 ENCOUNTER — Ambulatory Visit (INDEPENDENT_AMBULATORY_CARE_PROVIDER_SITE_OTHER): Payer: BC Managed Care – PPO | Admitting: Family Medicine

## 2017-09-13 VITALS — BP 112/68 | HR 76 | Temp 97.8°F | Ht 63.0 in | Wt 120.0 lb

## 2017-09-13 DIAGNOSIS — M818 Other osteoporosis without current pathological fracture: Secondary | ICD-10-CM

## 2017-09-13 DIAGNOSIS — Z0000 Encounter for general adult medical examination without abnormal findings: Secondary | ICD-10-CM | POA: Diagnosis not present

## 2017-09-13 MED ORDER — LEVOTHYROXINE SODIUM 50 MCG PO TABS: 50.0000 ug | ORAL_TABLET | Freq: Every day | ORAL | 3 refills | Status: DC

## 2017-09-13 MED ORDER — IBANDRONATE SODIUM 150 MG PO TABS: ORAL_TABLET | ORAL | 3 refills | Status: DC

## 2017-09-13 MED ORDER — SIMVASTATIN 20 MG PO TABS: 20.0000 mg | ORAL_TABLET | Freq: Every day | ORAL | 3 refills | Status: DC

## 2017-09-13 MED ORDER — OMEPRAZOLE 20 MG PO CPDR: 20.0000 mg | DELAYED_RELEASE_CAPSULE | Freq: Every day | ORAL | 3 refills | Status: DC

## 2017-09-13 MED ORDER — METOPROLOL SUCCINATE ER 50 MG PO TB24: ORAL_TABLET | ORAL | 3 refills | Status: DC

## 2017-09-13 NOTE — Progress Notes (Signed)
   Subjective:    Patient ID: Kristy [name], female    DOB: [birth year], 67 y.o.   MRN: [id number]  HPI Here for a well exam. She feels fine.    Review of Systems  Constitutional: Negative.   HENT: Negative.   Eyes: Negative.   Respiratory: Negative.   Cardiovascular: Negative.   Gastrointestinal: Negative.   Genitourinary: Negative for decreased urine volume, difficulty urinating, dyspareunia, dysuria, enuresis, flank pain, frequency, hematuria, pelvic pain and urgency.  Musculoskeletal: Negative.   Skin: Negative.   Neurological: Negative.   Psychiatric/Behavioral: Negative.        Objective:   Physical Exam  Constitutional: She is oriented to person, place, and time. She appears well-developed and well-nourished. No distress.  HENT:  Head: Normocephalic and atraumatic.  Right Ear: External ear normal.  Left Ear: External ear normal.  Nose: Nose normal.  Mouth/Throat: Oropharynx is clear and moist. No oropharyngeal exudate.  Eyes: Conjunctivae and EOM are normal. Pupils are equal, round, and reactive to light. No scleral icterus.  Neck: Normal range of motion. Neck supple. No JVD present. No thyromegaly present.  Cardiovascular: Normal rate, regular rhythm, normal heart sounds and intact distal pulses. Exam reveals no gallop and no friction rub.  No murmur heard. Pulmonary/Chest: Effort normal and breath sounds normal. No respiratory distress. She has no wheezes. She has no rales. She exhibits no tenderness.  Abdominal: Soft. Bowel sounds are normal. She exhibits no distension and no mass. There is no tenderness. There is no rebound and no guarding.  Musculoskeletal: Normal range of motion. She exhibits no edema or tenderness.  Lymphadenopathy:    She has no cervical adenopathy.  Neurological: She is alert and oriented to person, place, and time. She has normal reflexes. No cranial nerve deficit. She exhibits normal muscle tone. Coordination normal.  Skin: Skin is warm and  dry. No rash noted. No erythema.  Psychiatric: She has a normal mood and affect. Her behavior is normal. Judgment and thought content normal.          Assessment & Plan:  Well exam. We discussed diet and exercise. Get fasting labs soon. Set up another DEXA.  Alysia Penna, MD

## 2017-09-15 ENCOUNTER — Other Ambulatory Visit (INDEPENDENT_AMBULATORY_CARE_PROVIDER_SITE_OTHER): Payer: BC Managed Care – PPO

## 2017-09-15 ENCOUNTER — Ambulatory Visit (INDEPENDENT_AMBULATORY_CARE_PROVIDER_SITE_OTHER)
Admission: RE | Admit: 2017-09-15 | Discharge: 2017-09-15 | Disposition: A | Discharge status: Home / Self Care | Payer: BC Managed Care – PPO | Source: Ambulatory Visit | Attending: Family Medicine | Admitting: Family Medicine

## 2017-09-15 DIAGNOSIS — Z0000 Encounter for general adult medical examination without abnormal findings: Secondary | ICD-10-CM | POA: Diagnosis not present

## 2017-09-15 DIAGNOSIS — M818 Other osteoporosis without current pathological fracture: Secondary | ICD-10-CM | POA: Diagnosis not present

## 2017-09-15 LAB — HEPATIC FUNCTION PANEL
ALK PHOS: 28 U/L — AB (ref 39–117)
ALT: 13 U/L (ref 0–35)
AST: 18 U/L (ref 0–37)
Albumin: 4.2 g/dL (ref 3.5–5.2)
BILIRUBIN DIRECT: 0.1 mg/dL (ref 0.0–0.3)
BILIRUBIN TOTAL: 0.5 mg/dL (ref 0.2–1.2)
TOTAL PROTEIN: 6.7 g/dL (ref 6.0–8.3)

## 2017-09-15 LAB — POC URINALSYSI DIPSTICK (AUTOMATED)
BILIRUBIN UA: NEGATIVE
GLUCOSE UA: NEGATIVE
KETONES UA: NEGATIVE
LEUKOCYTES UA: NEGATIVE
NITRITE UA: NEGATIVE
PH UA: 6.5 (ref 5.0–8.0)
Protein, UA: NEGATIVE
Spec Grav, UA: 1.01 (ref 1.010–1.025)
Urobilinogen, UA: 0.2 E.U./dL

## 2017-09-15 LAB — CBC WITH DIFFERENTIAL/PLATELET
BASOS PCT: 0.5 % (ref 0.0–3.0)
Basophils Absolute: 0 10*3/uL (ref 0.0–0.1)
EOS ABS: 0.2 10*3/uL (ref 0.0–0.7)
Eosinophils Relative: 3.6 % (ref 0.0–5.0)
HCT: 37.7 % (ref 36.0–46.0)
Hemoglobin: 13 g/dL (ref 12.0–15.0)
Lymphocytes Relative: 36.4 % (ref 12.0–46.0)
Lymphs Abs: 2 10*3/uL (ref 0.7–4.0)
MCHC: 34.4 g/dL (ref 30.0–36.0)
MCV: 94.3 fl (ref 78.0–100.0)
MONO ABS: 0.5 10*3/uL (ref 0.1–1.0)
Monocytes Relative: 9.4 % (ref 3.0–12.0)
NEUTROS ABS: 2.7 10*3/uL (ref 1.4–7.7)
Neutrophils Relative %: 50.1 % (ref 43.0–77.0)
PLATELETS: 236 10*3/uL (ref 150.0–400.0)
RBC: 4 Mil/uL (ref 3.87–5.11)
RDW: 12.9 % (ref 11.5–15.5)
WBC: 5.4 10*3/uL (ref 4.0–10.5)

## 2017-09-15 LAB — LIPID PANEL
Cholesterol: 162 mg/dL (ref 0–200)
HDL: 57.6 mg/dL (ref >39.00)
LDL CALC: 90 mg/dL (ref 0–99)
NONHDL: 103.95
Total CHOL/HDL Ratio: 3
Triglycerides: 69 mg/dL (ref 0.0–149.0)
VLDL: 13.8 mg/dL (ref 0.0–40.0)

## 2017-09-15 LAB — BASIC METABOLIC PANEL
BUN: 14 mg/dL (ref 6–23)
CO2: 24 mEq/L (ref 19–32)
CREATININE: 0.69 mg/dL (ref 0.40–1.20)
Calcium: 9 mg/dL (ref 8.4–10.5)
Chloride: 102 mEq/L (ref 96–112)
GFR: 90.85 mL/min (ref >60.00)
GLUCOSE: 88 mg/dL (ref 70–99)
POTASSIUM: 3.8 meq/L (ref 3.5–5.1)
Sodium: 135 mEq/L (ref 135–145)

## 2017-09-15 LAB — TSH: TSH: 2.39 u[IU]/mL (ref 0.35–4.50)

## 2017-09-17 DIAGNOSIS — M818 Other osteoporosis without current pathological fracture: Secondary | ICD-10-CM | POA: Diagnosis not present

## 2017-10-17 ENCOUNTER — Telehealth: Payer: Self-pay | Admitting: Family Medicine

## 2017-10-17 NOTE — Telephone Encounter (Signed)
I spoke with pt and did schedule office visit tomorrow 10/18/2017.

## 2017-10-17 NOTE — Telephone Encounter (Unsigned)
Copied from [place]. Topic: Quick Communication - See Telephone Encounter >> Oct 17, 2017  3:43 PM Percell Belt A wrote: CRM for notification. See Telephone encounter for: 10/17/17. Pt called in and stated that she went to urgent care over the weekend with a uti.  They told her to fu with pcp for a fu lab to make sure infection was gone?  Will she needs appt or can labs just be put in?    Best number [phone]

## 2017-10-18 ENCOUNTER — Ambulatory Visit: Payer: BC Managed Care – PPO | Admitting: Family Medicine

## 2017-10-18 ENCOUNTER — Encounter: Payer: Self-pay | Admitting: Family Medicine

## 2017-10-18 VITALS — BP 102/64 | HR 64 | Temp 97.7°F | Ht 63.0 in | Wt 119.9 lb

## 2017-10-18 DIAGNOSIS — N3000 Acute cystitis without hematuria: Secondary | ICD-10-CM

## 2017-10-18 NOTE — Progress Notes (Signed)
Subjective:     Patient ID: Kristy [name], female   DOB: 17-Sep-1950, 67 y.o.   MRN: [id number]  HPI Patient seen for follow-up regarding recent UTI. She was seen at a [facility] clinic April 6 with urinary frequency and burning. Culture came back positive for Escherichia coli 25-50,000 colony count and sensitive to Augmentin which she was placed on. Symptoms have fully resolved at this time. She was told follow-up for recheck. No flank pain. No fevers or chills. No persistent symptoms.  Past Medical History:  Diagnosis Date  . Allergy   . Blood in stool   . Cancer ([place]) 1998   breast  . GERD (gastroesophageal reflux disease)   . Gynecologic exam normal    sees Dr. Christophe Louis  . Hyperlipidemia   . Hypothyroidism   . Osteoporosis    last DEXA 08-20-15   . Personal history of radiation therapy   . Posterior vitreous detachment of left eye    sees Dr. Starling Manns    Past Surgical History:  Procedure Laterality Date  . BREAST LUMPECTOMY  1998   left  . COLONOSCOPY  12/05/2016   per Dr. Ardis Hughs, no polyps, repeat in 5 yrs (pos family hx)   . TONSILLECTOMY  1959    reports that she has never smoked. She has never used smokeless tobacco. She reports that she does not drink alcohol or use drugs. family history includes ALS in her unknown relative; Arthritis in her unknown relative; Cancer in her mother; Colon cancer (age of onset: 6) in her mother; Colon cancer (age of onset: 69) in her paternal aunt; Heart attack in her father; Heart disease in her father and unknown relative; Hyperlipidemia in her unknown relative; Hypertension in her father, mother, and unknown relative; Mental illness in her unknown relative. Allergies  Allergen Reactions  . Ciprofloxacin     Diarrhea   . Levofloxacin     REACTION: neuralgia     Review of Systems  Constitutional: Negative for chills and fever.  Genitourinary: Negative for difficulty urinating, dysuria, flank pain and hematuria.       Objective:    Physical Exam  Constitutional: She appears well-developed and well-nourished.  Cardiovascular: Normal rate and regular rhythm.  Pulmonary/Chest: Effort normal and breath sounds normal. No respiratory distress. She has no wheezes. She has no rales.       Assessment:     Recent uncomplicated cystitis resolved following treatment with Augmentin. Urine dipstick today is completely normal    Plan:     -Reassurance. Stay well-hydrated. Follow-up promptly for any recurrent symptoms  Eulas Post MD [facility]

## 2017-12-19 ENCOUNTER — Ambulatory Visit: Payer: BC Managed Care – PPO | Admitting: Family Medicine

## 2017-12-19 ENCOUNTER — Encounter: Payer: Self-pay | Admitting: Family Medicine

## 2017-12-19 VITALS — BP 112/64 | HR 66 | Temp 98.3°F | Wt 121.0 lb

## 2017-12-19 DIAGNOSIS — N3000 Acute cystitis without hematuria: Secondary | ICD-10-CM

## 2017-12-19 DIAGNOSIS — R300 Dysuria: Secondary | ICD-10-CM | POA: Diagnosis not present

## 2017-12-19 LAB — POCT URINALYSIS DIPSTICK
Bilirubin, UA: NEGATIVE
Glucose, UA: NEGATIVE
Ketones, UA: NEGATIVE
Nitrite, UA: NEGATIVE
Protein, UA: POSITIVE — AB
Spec Grav, UA: 1.01
Urobilinogen, UA: 0.2 U/dL
pH, UA: 6.5

## 2017-12-19 MED ORDER — SULFAMETHOXAZOLE-TRIMETHOPRIM 800-160 MG PO TABS: 1.0000 | ORAL_TABLET | Freq: Two times a day (BID) | ORAL | 0 refills | Status: DC

## 2017-12-19 NOTE — Progress Notes (Signed)
   Subjective:    Patient ID: Kristy [name], female    DOB: 1950-07-25, 67 y.o.   MRN: [id number]   Review of Systems  Constitutional: Negative.   Respiratory: Negative.   Cardiovascular: Negative.   Gastrointestinal: Negative.   Genitourinary: Positive for dysuria, frequency and urgency.       Objective:   Physical Exam  Constitutional: She appears well-developed and well-nourished.  Neck: No thyromegaly present.  Cardiovascular: Normal rate, regular rhythm, normal heart sounds and intact distal pulses.  Pulmonary/Chest: Effort normal and breath sounds normal. No stridor. No respiratory distress. She has no wheezes. She has no rales.  Abdominal: Soft. Bowel sounds are normal. She exhibits no distension and no mass. There is no tenderness. There is no rebound and no guarding. No hernia.  Lymphadenopathy:    She has no cervical adenopathy.          Assessment & Plan:  UTI. Treat with Bactrim DS. Culture the sample.  Alysia Penna, MD

## 2017-12-21 LAB — URINE CULTURE
MICRO NUMBER:: 90757775
SPECIMEN QUALITY:: ADEQUATE

## 2017-12-31 ENCOUNTER — Other Ambulatory Visit: Payer: Self-pay | Admitting: Family Medicine

## 2018-01-15 ENCOUNTER — Telehealth: Payer: Self-pay | Admitting: Family Medicine

## 2018-01-15 NOTE — Telephone Encounter (Unsigned)
Copied from [place] [id number]. Topic: Quick Communication - See Telephone Encounter >> Jan 15, 2018  4:21 PM Neva Seat wrote: Pt is changing pharmacies - [facility] will be calling to give information for pt's all future medications.

## 2018-01-16 NOTE — Telephone Encounter (Signed)
Noted - thanks for the FYI

## 2018-01-25 ENCOUNTER — Encounter (HOSPITAL_COMMUNITY): Payer: Self-pay

## 2018-01-25 ENCOUNTER — Other Ambulatory Visit: Payer: Self-pay

## 2018-01-25 ENCOUNTER — Emergency Department (HOSPITAL_COMMUNITY)
Admission: EM | Admit: 2018-01-25 | Discharge: 2018-01-25 | Disposition: A | Discharge status: Home / Self Care | Payer: Medicare Other | Attending: Emergency Medicine | Admitting: Emergency Medicine

## 2018-01-25 ENCOUNTER — Emergency Department (HOSPITAL_COMMUNITY): Payer: Medicare Other

## 2018-01-25 DIAGNOSIS — R002 Palpitations: Secondary | ICD-10-CM | POA: Diagnosis not present

## 2018-01-25 DIAGNOSIS — Z79899 Other long term (current) drug therapy: Secondary | ICD-10-CM | POA: Insufficient documentation

## 2018-01-25 DIAGNOSIS — E039 Hypothyroidism, unspecified: Secondary | ICD-10-CM | POA: Insufficient documentation

## 2018-01-25 LAB — CBC
HCT: 39 % (ref 36.0–46.0)
Hemoglobin: 12.8 g/dL (ref 12.0–15.0)
MCH: 31.3 pg (ref 26.0–34.0)
MCHC: 32.8 g/dL (ref 30.0–36.0)
MCV: 95.4 fL (ref 78.0–100.0)
PLATELETS: 243 10*3/uL (ref 150–400)
RBC: 4.09 MIL/uL (ref 3.87–5.11)
RDW: 12.2 % (ref 11.5–15.5)
WBC: 5.9 10*3/uL (ref 4.0–10.5)

## 2018-01-25 LAB — BASIC METABOLIC PANEL
Anion gap: 9 (ref 5–15)
BUN: 6 mg/dL — AB (ref 8–23)
CALCIUM: 9.2 mg/dL (ref 8.9–10.3)
CO2: 27 mmol/L (ref 22–32)
CREATININE: 0.71 mg/dL (ref 0.44–1.00)
Chloride: 102 mmol/L (ref 98–111)
GFR calc Af Amer: >60 mL/min (ref >60)
Glucose, Bld: 99 mg/dL (ref 70–99)
POTASSIUM: 3.6 mmol/L (ref 3.5–5.1)
Sodium: 138 mmol/L (ref 135–145)

## 2018-01-25 LAB — I-STAT TROPONIN, ED: TROPONIN I, POC: 0.01 ng/mL (ref 0.00–0.08)

## 2018-01-25 NOTE — ED Notes (Signed)
Pt discharged from ED; instructions provided; Pt encouraged to return to ED if symptoms worsen and to f/u with PCP; Pt verbalized understanding of all instructions 

## 2018-01-25 NOTE — Discharge Instructions (Signed)
Follow-up with your cardiologist and primary care provider for further evaluation to determine if you need another Holter monitor for your heart. Return to ED for worsening symptoms, chest pain, shortness of breath, coughing up blood, lightheadedness or loss of consciousness.

## 2018-01-25 NOTE — ED Triage Notes (Signed)
Pt states that she is on Metoprolol for her A Fib, states that this morning she has felt like she is having an irregular HR, c/o feeling like she needs to cough.

## 2018-01-25 NOTE — ED Notes (Signed)
Patient transported to X-ray 

## 2018-01-25 NOTE — ED Provider Notes (Signed)
[facility] Provider Note   CSN: [id number] Arrival date & time: 01/25/18  1336     History   Chief Complaint Chief Complaint  Patient presents with  . Palpitations    HPI Kristy [name] is a 67 y.o. female with a past medical history of palpitations, breast cancer, who presents to ED for evaluation of palpitations.  She reports they have been going on intermittently for the past several years.  Of note, patient had Holter monitor showing one run of NSVT but cardiology states otherwise not concerning and negative exercise stress test done in 2014.  She states that the symptoms were worse recently.  They are especially worse this morning.  She put on a stethoscope that her father used to own and listen to her heart.  She stated that it would be normally and then all of a sudden she would hear a "boom" which happened about 3 times in the course of a several minutes.  She is also been experiencing a strange sensation in her throat feeling like she needs to cough.  This has been going on since March.  Denies any productive cough or hemoptysis.  She denies any current or prior chest pain, shortness of breath, leg swelling, injuries or falls.  She completed a physical in March 2019 where TSH was checked and was normal.  She last saw Dr. Marlou Porch of cardiology in March 2019.  Denies any prior MI, PE, DVT, recent surgeries, recent prolonged travel.  HPI  Past Medical History:  Diagnosis Date  . Allergy   . Blood in stool   . Cancer ([place]) 1998   breast  . GERD (gastroesophageal reflux disease)   . Gynecologic exam normal    sees Dr. Christophe Louis  . Hyperlipidemia   . Hypothyroidism   . Osteoporosis    last DEXA 08-20-15   . Personal history of radiation therapy   . Posterior vitreous detachment of left eye    sees Dr. Starling Manns     Patient Active Problem List   Diagnosis Date Noted  . External hemorrhoid 11/05/2014  . Atypical chest pain 07/23/2014    . NSVT (nonsustained ventricular tachycardia) ([place]) 06/03/2013  . Dyspnea 06/03/2013  . Palpitations 03/06/2013  . ACUTE BRONCHITIS 06/10/2010  . NEURALGIA 05/12/2010  . ACUTE SINUSITIS, UNSPECIFIED 04/12/2010  . URI 10/14/2009  . OVERACTIVE BLADDER 08/10/2009  . DYSURIA 08/10/2009  . HYPERLIPIDEMIA 06/02/2008  . ALLERGIC RHINITIS 06/02/2008  . GERD 06/02/2008  . Osteoporosis 06/02/2008  . CYSTITIS, ACUTE 02/28/2007  . BREAST CANCER, HX OF 02/15/2007    Past Surgical History:  Procedure Laterality Date  . BREAST LUMPECTOMY  1998   left  . COLONOSCOPY  12/05/2016   per Dr. Ardis Hughs, no polyps, repeat in 5 yrs (pos family hx)   . TONSILLECTOMY  1959     OB History   None      Home Medications    Prior to Admission medications   Medication Sig Start Date End Date Taking? Authorizing Provider  calcium-vitamin D (OSCAL WITH D) 500-200 MG-UNIT per tablet Take 1 tablet by mouth daily.      [provider]  Cholecalciferol (VITAMIN D-3) 1000 UNITS CAPS Take 1,000 Units by mouth daily.    [provider]  fish oil-omega-3 fatty acids 1000 MG capsule Take 1 g by mouth daily.      [provider]  fluticasone (FLONASE) 50 MCG/ACT nasal spray Place 1 spray into both nostrils daily.  [provider]  ibandronate (BONIVA) 150 MG tablet TAKE 1 TABLET ONCE MONTHLY 09/13/17   Laurey Morale, MD  levothyroxine (SYNTHROID, LEVOTHROID) 50 MCG tablet Take 1 tablet (50 mcg total) by mouth daily. 09/13/17   Laurey Morale, MD  loratadine (CLARITIN) 10 MG tablet Take 10 mg by mouth daily.      [provider]  metoprolol succinate (TOPROL-XL) 50 MG 24 hr tablet TAKE 1 TABLET DAILY WITH OR IMMEDIATELY FOLLOWING A MEAL 09/13/17   Laurey Morale, MD  Multiple Vitamin (MULTIVITAMIN) tablet Take 1 tablet by mouth daily.      [provider]  omeprazole (PRILOSEC) 20 MG capsule Take 1 capsule (20 mg total) by mouth daily. 09/13/17   Laurey Morale,  MD  simvastatin (ZOCOR) 20 MG tablet Take 1 tablet (20 mg total) by mouth at bedtime. 09/13/17   Laurey Morale, MD  sulfamethoxazole-trimethoprim (BACTRIM DS) 800-160 MG tablet Take 1 tablet by mouth 2 (two) times daily. 12/19/17   Laurey Morale, MD    Family History Family History  Problem Relation Age of Onset  . Colon cancer Mother 51  . Hypertension Mother   . Cancer Mother   . Colon cancer Paternal Aunt 78  . Heart disease Father   . Heart attack Father   . Hypertension Father   . ALS Unknown        fhx  . Arthritis Unknown        fhx  . Hyperlipidemia Unknown        fhx  . Hypertension Unknown        fhx  . Mental illness Unknown        fhx  . Heart disease Unknown        fhx  . Stomach cancer Neg Hx   . Breast cancer Neg Hx     Social History Social History   Tobacco Use  . Smoking status: Never Smoker  . Smokeless tobacco: Never Used  Substance Use Topics  . Alcohol use: No    Alcohol/week: 0.0 oz  . Drug use: No     Allergies   Ciprofloxacin and Levofloxacin   Review of Systems Review of Systems  Constitutional: Negative for appetite change, chills and fever.  HENT: Negative for ear pain, rhinorrhea, sneezing and sore throat.   Eyes: Negative for photophobia and visual disturbance.  Respiratory: Positive for cough. Negative for chest tightness, shortness of breath and wheezing.   Cardiovascular: Positive for palpitations. Negative for chest pain.  Gastrointestinal: Negative for abdominal pain, blood in stool, constipation, diarrhea, nausea and vomiting.  Genitourinary: Negative for dysuria, hematuria and urgency.  Musculoskeletal: Negative for myalgias.  Skin: Negative for rash.  Neurological: Negative for dizziness, weakness and light-headedness.     Physical Exam Updated Vital Signs BP 138/75   Pulse 77   Temp 99 F (37.2 C) (Oral)   Resp 19   Ht 5\' 3"  (1.6 m)   Wt 54.4 kg (120 lb)   SpO2 100%   BMI 21.26 kg/m   Physical Exam   Constitutional: She appears well-developed and well-nourished. No distress.  Nontoxic-appearing and in no acute distress.  Speaking complete sentences without difficulty.  HENT:  Head: Normocephalic and atraumatic.  Nose: Nose normal.  Eyes: Conjunctivae and EOM are normal. Right eye exhibits no discharge. Left eye exhibits no discharge. No scleral icterus.  Neck: Normal range of motion. Neck supple.  Cardiovascular: Normal rate, regular rhythm, normal heart sounds and intact  distal pulses. Exam reveals no gallop and no friction rub.  No murmur heard. Pulmonary/Chest: Effort normal and breath sounds normal. No respiratory distress.  Abdominal: Soft. Bowel sounds are normal. She exhibits no distension. There is no tenderness. There is no guarding.  Musculoskeletal: Normal range of motion. She exhibits no edema.  No lower extremity edema, erythema or calf tenderness bilaterally.  Neurological: She is alert. She exhibits normal muscle tone. Coordination normal.  Skin: Skin is warm and dry. No rash noted.  Psychiatric: She has a normal mood and affect.  Nursing note and vitals reviewed.    ED Treatments / Results  Labs (all labs ordered are listed, but only abnormal results are displayed) Labs Reviewed  BASIC METABOLIC PANEL - Abnormal; Notable for the following components:      Result Value   BUN 6 (*)    All other components within normal limits  CBC  I-STAT TROPONIN, ED    EKG EKG Interpretation  Date/Time:  Thursday January 25 2018 13:44:39 EDT Ventricular Rate:  93 PR Interval:  136 QRS Duration: 78 QT Interval:  368 QTC Calculation: 457 R Axis:   55 Text Interpretation:  Normal sinus rhythm Low voltage QRS Septal infarct , age undetermined Abnormal ECG No significant change since last tracing Confirmed by Deno Etienne [phone]) on 01/25/2018 2:01:33 PM   Radiology Dg Chest 2 View  Result Date: 01/25/2018 CLINICAL DATA:  Heart palpitations. EXAM: CHEST - 2 VIEW COMPARISON:   None. FINDINGS: Lungs are hyperexpanded. The lungs are clear without focal pneumonia, edema, pneumothorax or pleural effusion. Interstitial markings are diffusely coarsened with chronic features. Tiny right infrahilar nodule likely vessel on end. The visualized bony structures of the thorax are intact. Telemetry leads overlie the chest. IMPRESSION: Hyperexpansion without acute cardiopulmonary findings. Electronically Signed   By: Misty Stanley M.D.   On: 01/25/2018 14:27    Procedures Procedures (including critical care time)  Medications Ordered in ED Medications - No data to display   Initial Impression / Assessment and Plan / ED Course  I have reviewed the triage vital signs and the nursing notes.  Pertinent labs & imaging results that were available during my care of the patient were reviewed by me and considered in my medical decision making (see chart for details).     67 year old female with a past medical history of palpitations, breast cancer presents to ED for evaluation of ongoing palpitations.  States that they have been intermittent for the past several years, especially worsened in the past few months.  She noticed this morning that her heart was "having an extra beat."  She put on the stethoscope that her father used to own and listen to her own heart.  She would hear a "boom" intermittently which prompted her to come to the ED.  She denies any chest pain, shortness of breath, fever, history of PE, MI.  She last saw Dr. Marlou Porch of cardiology in March 2019.  Holter monitor was placed in 2014.  Exercise stress test in 2014 was negative.  On physical exam she is overall well-appearing.  No arrhythmias noted on exam.  No lower extremity edema concerning for DVT.  She had her thyroid checked in March 2019.  Vital signs are reassuring.  EKG shows normal sinus rhythm with no significant change since prior tracings.  Chest x-ray is negative for acute abnormality. CBC, BMP, troponin returned as  unremarkable.  Patient remains symptom-free in the ED with no recurrence of her palpitations.  She  continues to deny chest pain or shortness of breath.  Will advise her to follow-up with her PCP and cardiologist for possible additional Holter monitoring. Advised to return to ED for any severe or worsening symptoms..  Patient discussed with and seen by my attending, Dr. Tyrone Nine.  Portions of this note were generated with [employer]. Dictation errors may occur despite best attempts at proofreading.   Final Clinical Impressions(s) / ED Diagnoses   Final diagnoses:  Palpitations    ED Discharge Orders    None       Delia Heady, PA-C 01/25/18 [place], [place], DO 01/25/18 1535

## 2018-01-25 NOTE — ED Provider Notes (Signed)
Patient placed in Quick Look pathway, seen and evaluated   Chief Complaint: Palpitations  HPI:   Patient presents with palpitations that she has been getting intermittently for years, but worse recently.  She reports they were worse this morning and she did feel a in the midst of her heart beating fast.  She is also had a strange feeling in her throat that makes her feel like she has to cough.  She denies chest pain or shortness of breath.    ROS: + Palpitations, -chest pain, shortness of breath  Physical Exam:   Gen: No distress  Neuro: Awake and Alert  Skin: Warm    Focused Exam: Heart normal rate and rhythm, lungs clear to auscultation   Initiation of care has begun. The patient has been counseled on the process, plan, and necessity for staying for the completion/evaluation, and the remainder of the medical screening examination    Frederica Kuster, PA-C 01/25/18 [place], DO 01/25/18 1536

## 2018-01-26 ENCOUNTER — Encounter: Payer: Self-pay | Admitting: *Deleted

## 2018-01-31 ENCOUNTER — Ambulatory Visit: Payer: Medicare Other | Admitting: Cardiology

## 2018-01-31 ENCOUNTER — Encounter: Payer: Self-pay | Admitting: Cardiology

## 2018-01-31 VITALS — BP 128/64 | HR 72 | Ht 63.0 in | Wt 121.4 lb

## 2018-01-31 DIAGNOSIS — R002 Palpitations: Secondary | ICD-10-CM | POA: Diagnosis not present

## 2018-01-31 NOTE — Patient Instructions (Signed)
Medication Instructions:  Your physician recommends that you continue on your current medications as directed. Please refer to the Current Medication list given to you today.  Testing/Procedures: Your physician has recommended that you wear an event monitor. Event monitors are medical devices that record the heart's electrical activity. Doctors most often [place] these monitors to diagnose arrhythmias. Arrhythmias are problems with the speed or rhythm of the heartbeat. The monitor is a small, portable device. You can wear one while you do your normal daily activities. This is usually used to diagnose what is causing palpitations/syncope (passing out).  Follow-Up: Your physician wants you to follow-up in: 1 year with Dr. Marlou Porch. You will receive a reminder letter in the mail two months in advance. If you don't receive a letter, please call our office to schedule the follow-up appointment.  If you need a refill on your cardiac medications before your next appointment, please call your pharmacy.

## 2018-01-31 NOTE — Progress Notes (Signed)
[facility]. [address] [place], [place]  [address] Phone: [phone] Fax:  [phone]  Date:  01/31/2018   ID:  Kristy [name], DOB Sep 28, 1950, MRN [id number]  PCP:  Laurey Morale, MD   History of Present Illness: Kristy [name] is a 68 y.o. female here for evaluations of palpitations. Reassuring workup with echocardiogram and exercise treadmill test in December 2014 normal.   Back in 06/2012 had few palpitations at the beach. Like a "flutter". Over a few months they lessened. In September, came back. Started metoprolol. Then came back again, a little bit. Went up to 50mg  of metoprolol. No longer flutters but having thumping like a baby kicking. Maybe cold or lifting previously effected. Seemed to happen more with lifting. Off caffeine. They are increasing in frequency and are sometimes lasting longer durations. At times, they can last one hour in duration. They feel like a pounding. They do not seem to be prolonged racing episodes. Denies any syncope, angina, chest pain, shortness of breath. However, she does state that when walking up [place], she can feel shortness of breath which has been going on for quite some time.  She has had experience of vasovagal syncope during blood draws or procedures. She's currently taking medication for hyperlipidemia. 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 She has a history of left breast lumpectomy, intraductal.  Father had 3 MI's in his 59's. [facility]. ALS.      07/23/14 - was doing well until mid December. Started having fluttering feeling left flank/ breast. Had radiation prior. When arm is certain position feels a twitch. ? Heart she states. Stressful fall, mother died in 03/21/23. Family pressures. Sister with new boyfriend. Now feels better. Atypical CP resolved with eating.   09/04/17 - on 3/1 felt fluttering weird feelings all day for 2 days. Went away after 3 days. GERD like discomfort. At night.  Eating earlier. Light pain.   01/31/2018- she was in the emergency department on 01/25/2018 with palpitations, saw Dr. Tyrone Nine.  She thinks that she is going in and out of atrial fibrillation.  She thought she had a new heart sound.  Resolved by the time she came to the ER.  Reassuring.  Heart rate would be normal and all of a sudden she would hear a boom.  Happened about 3 times over minute.  Strange sensation like she needs to cough. Little flutters. Spells in AM then after meds and during day OK. Has GERD. Has feeling of cough, takes breath for a second. Periodic.  Blood pressure has been under good control.  She has not had any strokelike symptoms no bleeding syncope orthopnea PND chest pain.  Wt Readings from Last 3 Encounters:  01/31/18 121 lb 6.4 oz (55.1 kg)  01/25/18 120 lb (54.4 kg)  12/19/17 121 lb (54.9 kg)     Past Medical History:  Diagnosis Date  . Allergy   . Blood in stool   . Cancer ([place]) 1998   breast  . GERD (gastroesophageal reflux disease)   . Gynecologic exam normal    sees Dr. Christophe Louis  . Hyperlipidemia   . Hypothyroidism   . Osteoporosis    last DEXA 08-20-15   . Personal history of radiation therapy   . Posterior vitreous detachment of left eye    sees Dr. Starling Manns     Past Surgical History:  Procedure Laterality Date  . BREAST LUMPECTOMY  1998  left  . COLONOSCOPY  12/05/2016   per Dr. Ardis Hughs, no polyps, repeat in 5 yrs (pos family hx)   . TONSILLECTOMY  1959    Current Outpatient Medications  Medication Sig Dispense Refill  . calcium-vitamin D (OSCAL WITH D) 500-200 MG-UNIT per tablet Take 1 tablet by mouth every evening.     . Cholecalciferol (VITAMIN D-3) 1000 UNITS CAPS Take 1,000 Units by mouth daily.    . fish oil-omega-3 fatty acids 1000 MG capsule Take 1 g by mouth daily.      . fluticasone (FLONASE) 50 MCG/ACT nasal spray Place 1 spray into both nostrils daily as needed for allergies.     Marland Kitchen ibandronate (BONIVA) 150 MG tablet Take 150 mg by  mouth every 30 (thirty) days. Take in the morning with a full glass of water, on an empty stomach, and do not take anything else by mouth or lie down for the next 30 min.    Marland Kitchen levothyroxine (SYNTHROID, LEVOTHROID) 50 MCG tablet Take 1 tablet (50 mcg total) by mouth daily. 90 tablet 3  . loratadine (CLARITIN) 10 MG tablet Take 10 mg by mouth daily.      . metoprolol succinate (TOPROL-XL) 50 MG 24 hr tablet Take 50 mg by mouth daily. Take with or immediately following a meal.    . Multiple Vitamin (MULTIVITAMIN) tablet Take 1 tablet by mouth every evening.     Marland Kitchen omeprazole (PRILOSEC) 20 MG capsule Take 20 mg by mouth daily.    . simvastatin (ZOCOR) 20 MG tablet Take 20 mg by mouth daily.     Current Facility-Administered Medications  Medication Dose Route Frequency Provider Last Rate Last Dose  . 0.9 %  sodium chloride infusion  500 mL Intravenous Continuous Milus Banister, MD        Allergies:    Allergies  Allergen Reactions  . Ciprofloxacin Diarrhea       . Levofloxacin Other (See Comments)    REACTION: neuralgia    Social History:  The patient  reports that she has never smoked. She has never used smokeless tobacco. She reports that she does not drink alcohol or use drugs.   Family History  Problem Relation Age of Onset  . Colon cancer Mother 55  . Hypertension Mother   . Cancer Mother   . Colon cancer Paternal Aunt 78  . Heart disease Father   . Heart attack Father   . Hypertension Father   . ALS Unknown        fhx  . Arthritis Unknown        fhx  . Hyperlipidemia Unknown        fhx  . Hypertension Unknown        fhx  . Mental illness Unknown        fhx  . Heart disease Unknown        fhx  . Stomach cancer Neg Hx   . Breast cancer Neg Hx     ROS:  Please see the history of present illness.   All other ROS negative   PHYSICAL EXAM: VS:  BP 128/64   Pulse 72   Ht 5\' 3"  (1.6 m)   Wt 121 lb 6.4 oz (55.1 kg)   BMI 21.51 kg/m  GEN: Thin, in no acute distress    HEENT: normal  Neck: no JVD, carotid bruits, or masses Cardiac: RRR; no murmurs, rubs, or gallops,no edema  Respiratory:  clear to auscultation bilaterally, normal work of breathing GI:  soft, nontender, nondistended, + BS MS: no deformity or atrophy  Skin: warm and dry, no rash Neuro:  Alert and Oriented x 3, Strength and sensation are intact Psych: euthymic mood, full affect    TSH 2.78 LDL 81 LFTs normal Hemoglobin 13.5  07/13/12-48 hour Holter monitor-occasional PACs, 6 beats NSVT at 4 AM, asymptomatic  EKG: 09/04/17 - NSR, NSSTW abnormality. 07/23/14 - NSR 66, no other changes.  Normal rhythm, 71, no other abnormalities, normal QT  Echocardiogram: 06/19/13-normal ejection fraction  ASSESSMENT AND PLAN:   Palpitations - Seems to be describing PVCs or PACs.  We will check a 30-day event monitor to ensure that she does not have any adverse arrhythmias or atrial fibrillation.  She knows to avoid excessive caffeine.  She does eat a little bit of chocolate here and there.  No Sudafed.  Thyroid reassuring.   Prior exercise treadmill test showed no signs of ischemia.  Hyperlipidemia  - statin, continue.  Doing well.  One-year follow-up   Signed, Candee Furbish, MD [facility]  01/31/2018 10:33 AM

## 2018-02-01 ENCOUNTER — Ambulatory Visit (INDEPENDENT_AMBULATORY_CARE_PROVIDER_SITE_OTHER): Payer: Medicare Other

## 2018-02-01 DIAGNOSIS — R002 Palpitations: Secondary | ICD-10-CM | POA: Diagnosis not present

## 2018-02-08 ENCOUNTER — Other Ambulatory Visit: Payer: Self-pay | Admitting: Obstetrics and Gynecology

## 2018-02-08 DIAGNOSIS — Z1231 Encounter for screening mammogram for malignant neoplasm of breast: Secondary | ICD-10-CM

## 2018-03-12 ENCOUNTER — Telehealth: Payer: Self-pay

## 2018-03-12 NOTE — Telephone Encounter (Signed)
-----   Message from Jerline Pain, MD sent at 03/12/2018  3:01 PM EDT -----  Occasional PVCs, 3000, occasional PACs 3000, overall less than 1%  No atrial fibrillation, no adverse arrhythmias  Chest pain/other symptoms associated with sinus rhythm.  Overall reassuring monitor.   Candee Furbish, MD

## 2018-03-12 NOTE — Telephone Encounter (Signed)
Notes recorded by Frederik Schmidt, RN on 03/12/2018 at 3:10 PM EDT [id number] 9/16

## 2018-03-19 ENCOUNTER — Ambulatory Visit
Admission: RE | Admit: 2018-03-19 | Discharge: 2018-03-19 | Disposition: A | Discharge status: Home / Self Care | Payer: Medicare Other | Source: Ambulatory Visit | Attending: Obstetrics and Gynecology | Admitting: Obstetrics and Gynecology

## 2018-03-19 DIAGNOSIS — Z1231 Encounter for screening mammogram for malignant neoplasm of breast: Secondary | ICD-10-CM

## 2018-07-07 ENCOUNTER — Other Ambulatory Visit: Payer: Self-pay | Admitting: Family Medicine

## 2018-08-13 ENCOUNTER — Other Ambulatory Visit: Payer: Self-pay | Admitting: Family Medicine

## 2018-09-04 ENCOUNTER — Other Ambulatory Visit: Payer: Self-pay | Admitting: Family Medicine

## 2018-09-17 ENCOUNTER — Encounter: Payer: Medicare Other | Admitting: Family Medicine

## 2018-10-24 ENCOUNTER — Other Ambulatory Visit: Payer: Self-pay | Admitting: Family Medicine

## 2018-10-26 ENCOUNTER — Encounter: Payer: Self-pay | Admitting: Family Medicine

## 2018-10-26 ENCOUNTER — Ambulatory Visit (INDEPENDENT_AMBULATORY_CARE_PROVIDER_SITE_OTHER): Payer: Medicare Other | Admitting: Family Medicine

## 2018-10-26 ENCOUNTER — Other Ambulatory Visit: Payer: Self-pay

## 2018-10-26 DIAGNOSIS — N390 Urinary tract infection, site not specified: Secondary | ICD-10-CM | POA: Diagnosis not present

## 2018-10-26 MED ORDER — SULFAMETHOXAZOLE-TRIMETHOPRIM 800-160 MG PO TABS: 1.0000 | ORAL_TABLET | Freq: Two times a day (BID) | ORAL | 0 refills | Status: DC

## 2018-10-26 NOTE — Progress Notes (Signed)
Subjective:    Patient ID: Kristy [name], female    DOB: 22-May-1951, 68 y.o.   MRN: [id number]  HPI Virtual Visit via Video Note  I connected with the patient on 10/26/18 at  3:15 PM EDT by a video enabled telemedicine application and verified that I am speaking with the correct person using two identifiers.  Location patient: home Location provider:work or home office Persons participating in the virtual visit: patient, provider  I discussed the limitations of evaluation and management by telemedicine and the availability of in person appointments. The patient expressed understanding and agreed to proceed.   HPI: Here for 2 days of urinary urgency and burning. No blood seen. No fever. She is drinking lots of water.    ROS: See pertinent positives and negatives per HPI.  Past Medical History:  Diagnosis Date  . Allergy   . Blood in stool   . Cancer ([place]) 1998   breast  . GERD (gastroesophageal reflux disease)   . Gynecologic exam normal    sees Dr. Christophe Louis  . Hyperlipidemia   . Hypothyroidism   . Osteoporosis    last DEXA 08-20-15   . Personal history of radiation therapy   . Posterior vitreous detachment of left eye    sees Dr. Starling Manns     Past Surgical History:  Procedure Laterality Date  . BREAST LUMPECTOMY  1998   left  . COLONOSCOPY  12/05/2016   per Dr. Ardis Hughs, no polyps, repeat in 5 yrs (pos family hx)   . TONSILLECTOMY  1959    Family History  Problem Relation Age of Onset  . Colon cancer Mother 66  . Hypertension Mother   . Cancer Mother   . Colon cancer Paternal Aunt 78  . Heart disease Father   . Heart attack Father   . Hypertension Father   . ALS Unknown        fhx  . Arthritis Unknown        fhx  . Hyperlipidemia Unknown        fhx  . Hypertension Unknown        fhx  . Mental illness Unknown        fhx  . Heart disease Unknown        fhx  . Stomach cancer Neg Hx   . Breast cancer Neg Hx      Current Outpatient  Medications:  .  calcium-vitamin D (OSCAL WITH D) 500-200 MG-UNIT per tablet, Take 1 tablet by mouth every evening. , Disp: , Rfl:  .  Cholecalciferol (VITAMIN D-3) 1000 UNITS CAPS, Take 1,000 Units by mouth daily., Disp: , Rfl:  .  fish oil-omega-3 fatty acids 1000 MG capsule, Take 1 g by mouth daily.  , Disp: , Rfl:  .  fluticasone (FLONASE) 50 MCG/ACT nasal spray, Place 1 spray into both nostrils daily as needed for allergies. , Disp: , Rfl:  .  ibandronate (BONIVA) 150 MG tablet, Take 150 mg by mouth every 30 (thirty) days. Take in the morning with a full glass of water, on an empty stomach, and do not take anything else by mouth or lie down for the next 30 min., Disp: , Rfl:  .  levothyroxine (SYNTHROID, LEVOTHROID) 50 MCG tablet, TAKE 1 TABLET BY MOUTH  DAILY, Disp: 90 tablet, Rfl: 3 .  loratadine (CLARITIN) 10 MG tablet, Take 10 mg by mouth daily.  , Disp: , Rfl:  .  metoprolol succinate (TOPROL-XL) 50 MG 24 hr tablet,  TAKE 1 TABLET BY MOUTH  DAILY WITH OR IMMEDIATELY  FOLLOWING A MEAL, Disp: 90 tablet, Rfl: 1 .  Multiple Vitamin (MULTIVITAMIN) tablet, Take 1 tablet by mouth every evening. , Disp: , Rfl:  .  omeprazole (PRILOSEC) 20 MG capsule, TAKE 1 CAPSULE BY MOUTH  DAILY, Disp: 90 capsule, Rfl: 1 .  simvastatin (ZOCOR) 20 MG tablet, TAKE 1 TABLET BY MOUTH  EVERY NIGHT AT BEDTIME, Disp: 90 tablet, Rfl: 3 .  sulfamethoxazole-trimethoprim (BACTRIM DS) 800-160 MG tablet, Take 1 tablet by mouth 2 (two) times daily., Disp: 14 tablet, Rfl: 0  Current Facility-Administered Medications:  .  0.9 %  sodium chloride infusion, 500 mL, Intravenous, Continuous, Milus Banister, MD  EXAM:  VITALS per patient if applicable:  GENERAL: alert, oriented, appears well and in no acute distress  HEENT: atraumatic, conjunttiva clear, no obvious abnormalities on inspection of external nose and ears  NECK: normal movements of the head and neck  LUNGS: on inspection no signs of respiratory distress,  breathing rate appears normal, no obvious gross SOB, gasping or wheezing  CV: no obvious cyanosis  MS: moves all visible extremities without noticeable abnormality  PSYCH/NEURO: pleasant and cooperative, no obvious depression or anxiety, speech and thought processing grossly intact  ASSESSMENT AND PLAN: UTI, treat with Bactrim DS. She will recheck prn.  Alysia Penna, MD  Discussed the following assessment and plan:  No diagnosis found.     I discussed the assessment and treatment plan with the patient. The patient was provided an opportunity to ask questions and all were answered. The patient agreed with the plan and demonstrated an understanding of the instructions.   The patient was advised to call back or seek an in-person evaluation if the symptoms worsen or if the condition fails to improve as anticipated.     Review of Systems     Objective:   Physical Exam        Assessment & Plan:

## 2018-10-29 NOTE — Telephone Encounter (Signed)
I refilled this for one year  

## 2018-10-29 NOTE — Telephone Encounter (Signed)
Last filled by a historical provider. Last OV 10/26/2018  Ok to fill?

## 2018-12-04 ENCOUNTER — Telehealth: Payer: Self-pay

## 2018-12-04 NOTE — Telephone Encounter (Signed)
YOUR CARDIOLOGY TEAM HAS ARRANGED FOR AN E-VISIT FOR YOUR APPOINTMENT - PLEASE REVIEW IMPORTANT INFORMATION BELOW SEVERAL DAYS PRIOR TO YOUR APPOINTMENT  Due to the recent COVID-19 pandemic, we are transitioning in-person office visits to tele-medicine visits in an effort to decrease unnecessary exposure to our patients, their families, and staff. These visits are billed to your insurance just like a normal visit is. We also encourage you to sign up for MyChart if you have not already done so. You will need a smartphone if possible. For patients that do not have this, we can still complete the visit using a regular telephone but do prefer a smartphone to enable video when possible. You may have a family member that lives with you that can help. If possible, we also ask that you have a blood pressure cuff and scale at home to measure your blood pressure, heart rate and weight prior to your scheduled appointment. Patients with clinical needs that need an in-person evaluation and testing will still be able to come to the office if absolutely necessary. If you have any questions, feel free to call our office.     YOUR PROVIDER WILL BE USING THE FOLLOWING PLATFORM TO COMPLETE YOUR VISIT: [email or web address]  . IF USING MYCHART - How to Download the MyChart App to Your SmartPhone   - If Apple, go to App Store and type in MyChart in the search bar and download the app. If Android, ask patient to go to Google Play Store and type in MyChart in the search bar and download the app. The app is free but as with any other app downloads, your phone may require you to verify saved payment information or Apple/Android password.  - You will need to then log into the app with your MyChart username and password, and select [place] as your healthcare provider to link the account.  - When it is time for your visit, go to the MyChart app, find appointments, and click Begin Video Visit. Be sure to Select Allow for your device to  access the Microphone and Camera for your visit. You will then be connected, and your provider will be with you shortly.  **If you have any issues connecting or need assistance, please contact MyChart service desk [phone] ([phone])**  **If using a computer, in order to ensure the best quality for your visit, you will need to use either of the following Internet Browsers: Google Chrome or Microsoft Edge**  . IF USING DOXIMITY or [email or web address].ME - The staff will give you instructions on receiving your link to join the meeting the day of your visit.      2-3 DAYS BEFORE YOUR APPOINTMENT  You will receive a telephone call from one of our [facility] team members - your caller ID may say "Unknown caller." If this is a video visit, we will walk you through how to get the video launched on your phone. We will remind you check your blood pressure, heart rate and weight prior to your scheduled appointment. If you have an Apple Watch or Kardia, please upload any pertinent ECG strips the day before or morning of your appointment to MyChart. Our staff will also make sure you have reviewed the consent and agree to move forward with your scheduled tele-health visit.     THE DAY OF YOUR APPOINTMENT  Approximately 15 minutes prior to your scheduled appointment, you will receive a telephone call from one of HeartCare team - your caller ID may say "Unknown caller."    Our staff will confirm medications, vital signs for the day and any symptoms you may be experiencing. Please have this information available prior to the time of visit start. It may also be helpful for you to have a pad of paper and pen handy for any instructions given during your visit. They will also walk you through joining the smartphone meeting if this is a video visit.    CONSENT FOR TELE-HEALTH VISIT - PLEASE REVIEW  I hereby voluntarily request, consent and authorize [facility] and its employed or contracted physicians, physician  assistants, nurse practitioners or other licensed health care professionals (the Practitioner), to provide me with telemedicine health care services (the "Services") as deemed necessary by the treating Practitioner. I acknowledge and consent to receive the Services by the Practitioner via telemedicine. I understand that the telemedicine visit will involve communicating with the Practitioner through live audiovisual communication technology and the disclosure of certain medical information by electronic transmission. I acknowledge that I have been given the opportunity to request an in-person assessment or other available alternative prior to the telemedicine visit and am voluntarily participating in the telemedicine visit.  I understand that I have the right to withhold or withdraw my consent to the use of telemedicine in the course of my care at any time, without affecting my right to future care or treatment, and that the Practitioner or I may terminate the telemedicine visit at any time. I understand that I have the right to inspect all information obtained and/or recorded in the course of the telemedicine visit and may receive copies of available information for a reasonable fee.  I understand that some of the potential risks of receiving the Services via telemedicine include:  . Delay or interruption in medical evaluation due to technological equipment failure or disruption; . Information transmitted may not be sufficient (e.g. poor resolution of images) to allow for appropriate medical decision making by the Practitioner; and/or  . In rare instances, security protocols could fail, causing a breach of personal health information.  Furthermore, I acknowledge that it is my responsibility to provide information about my medical history, conditions and care that is complete and accurate to the best of my ability. I acknowledge that Practitioner's advice, recommendations, and/or decision may be based on  factors not within their control, such as incomplete or inaccurate data provided by me or distortions of diagnostic images or specimens that may result from electronic transmissions. I understand that the practice of medicine is not an exact science and that Practitioner makes no warranties or guarantees regarding treatment outcomes. I acknowledge that I will receive a copy of this consent concurrently upon execution via email to the email address I last provided but may also request a printed copy by calling the office of [facility].    I understand that my insurance will be billed for this visit.   I have read or had this consent read to me. . I understand the contents of this consent, which adequately explains the benefits and risks of the Services being provided via telemedicine.  . I have been provided ample opportunity to ask questions regarding this consent and the Services and have had my questions answered to my satisfaction. . I give my informed consent for the services to be provided through the use of telemedicine in my medical care  By participating in this telemedicine visit I agree to the above.  

## 2018-12-05 ENCOUNTER — Encounter: Payer: Self-pay | Admitting: Cardiology

## 2018-12-05 ENCOUNTER — Other Ambulatory Visit: Payer: Self-pay

## 2018-12-05 ENCOUNTER — Telehealth (INDEPENDENT_AMBULATORY_CARE_PROVIDER_SITE_OTHER): Payer: Medicare Other | Admitting: Cardiology

## 2018-12-05 VITALS — BP 118/63 | HR 60 | Ht 63.0 in | Wt 118.5 lb

## 2018-12-05 DIAGNOSIS — R002 Palpitations: Secondary | ICD-10-CM

## 2018-12-05 DIAGNOSIS — E7800 Pure hypercholesterolemia, unspecified: Secondary | ICD-10-CM

## 2018-12-05 DIAGNOSIS — R0789 Other chest pain: Secondary | ICD-10-CM

## 2018-12-05 NOTE — Patient Instructions (Signed)
Medication Instructions:  No changes If you need a refill on your cardiac medications before your next appointment, please call your pharmacy.   Lab work: none If you have labs (blood work) drawn today and your tests are completely normal, you will receive your results only by: Marland Kitchen MyChart Message (if you have MyChart) OR . A paper copy in the mail If you have any lab test that is abnormal or we need to change your treatment, we will call you to review the results.  Testing/Procedures: none  Follow-Up: At [facility], you and your health needs are our priority.  As part of our continuing mission to provide you with exceptional heart care, we have created designated Provider Care Teams.  These Care Teams include your primary Cardiologist (physician) and Advanced Practice Providers (APPs -  Physician Assistants and Nurse Practitioners) who all work together to provide you with the care you need, when you need it. You will need a follow up appointment in 12 months.  Please call our office 2 months in advance to schedule this appointment.  You may see Candee Furbish, MD or one of the following Advanced Practice Providers on your designated Care Team:   Truitt Merle, NP Cecilie Kicks, NP . Kathyrn Drown, NP  Any Other Special Instructions Will Be Listed Below (If Applicable).

## 2018-12-05 NOTE — Progress Notes (Signed)
Virtual Visit via Telephone Note   This visit type was conducted due to national recommendations for restrictions regarding the COVID-19 Pandemic (e.g. social distancing) in an effort to limit this patient's exposure and mitigate transmission in our community.  Due to her co-morbid illnesses, this patient is at least at moderate risk for complications without adequate follow up.  This format is felt to be most appropriate for this patient at this time.  The patient did not have access to video technology/had technical difficulties with video requiring transitioning to audio format only (telephone).  All issues noted in this document were discussed and addressed.  No physical exam could be performed with this format.  Please refer to the patient's chart for her  consent to telehealth for [facility].   Date:  12/05/2018   ID:  Kristy [name], DOB [birth year], MRN [id number]  Patient Location: Home Provider Location: Home  PCP:  Kristy Morale, MD  Cardiologist:  Kristy Furbish, MD  Electrophysiologist:  None   Evaluation Performed:  Follow-Up Visit  Chief Complaint:  palpitations  History of Present Illness:    Kristy [name] is a 68 y.o. female with palpitations follow-up.  Back in 06/2012 had few palpitations at the beach. Like a "flutter". Over a few months they lessened. In September, came back. Started metoprolol. Then came back again, a little bit. Went up to 50mg  of metoprolol. No longer flutters but having thumping like a baby kicking. Maybe cold or lifting previously effected. Seemed to happen more with lifting. Off caffeine. They are increasing in frequency and are sometimes lasting longer durations. At times, they can last one hour in duration. They feel like a pounding. They do not seem to be prolonged racing episodes. Denies any syncope, angina, chest [name], shortness of breath. However, she does state that when walking up [place], she can feel shortness of breath which has been  going on for quite some time.  She has had experience of vasovagal syncope during blood draws or procedures. She's currently taking medication for hyperlipidemia. 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 She has a history of left breast lumpectomy, intraductal.  Father had 3 MI's in his 80's. [facility]. ALS.      07/23/14 - was doing well until mid December. Started having fluttering feeling left flank/ breast. Had radiation prior. When arm is certain position feels a twitch. ? Heart she states. Stressful fall, mother died in 04-03-2023. Family pressures. Sister with new boyfriend. Now feels better. Atypical CP resolved with eating.   09/04/17 - on 3/1 felt fluttering weird feelings all day for 2 days. Went away after 3 days. GERD like discomfort. At night. Eating earlier. Light [name].   01/31/2018- she was in the emergency department on 01/25/2018 with palpitations, saw Dr. Tyrone [name].  She thinks that she is going in and out of atrial fibrillation.  She thought she had a new heart sound.  Resolved by the time she came to the ER.  Reassuring.  Heart rate would be normal and all of a sudden she would hear a boom.  Happened about 3 times over minute.  Strange sensation like she needs to cough. Little flutters. Spells in AM then after meds and during day OK. Has GERD. Has feeling of cough, takes breath for a second. Periodic.  Blood pressure has been under good control.  She has not had any strokelike symptoms no bleeding syncope orthopnea PND chest [name].  12/05/18 -  sharp fleeting CP 6-8 episodes, bathroom 3 times. Continued little [name] side of sternum. Go away. All day.   The patient does not have symptoms concerning for COVID-19 infection (fever, chills, cough, or new shortness of breath).    Past Medical History:  Diagnosis Date   Allergy    Blood in stool    Cancer ([place]) 1998   breast   GERD (gastroesophageal reflux disease)    Gynecologic exam normal     sees Dr. Christophe [name]   Hyperlipidemia    Hypothyroidism    Osteoporosis    last DEXA 08-20-15    Personal history of radiation therapy    Posterior vitreous detachment of left eye    sees Dr. Starling [name]    Past Surgical History:  Procedure Laterality Date   BREAST LUMPECTOMY  1998   left   COLONOSCOPY  12/05/2016   per Dr. Ardis Hughs, no polyps, repeat in 5 yrs (pos family hx)    TONSILLECTOMY  1959     Current Meds  Medication Sig   calcium-vitamin D (OSCAL WITH D) 500-200 MG-UNIT per tablet Take 1 tablet by mouth every evening.    Cholecalciferol (VITAMIN D-3) 1000 UNITS CAPS Take 1,000 Units by mouth daily.   fish oil-omega-3 fatty acids 1000 MG capsule Take 1 g by mouth daily.     fluticasone (FLONASE) 50 MCG/ACT nasal spray Place 1 spray into both nostrils daily as needed for allergies.    ibandronate (BONIVA) 150 MG tablet TAKE 1 TABLET BY MOUTH ONCE MONTHLY   levothyroxine (SYNTHROID, LEVOTHROID) 50 MCG tablet TAKE 1 TABLET BY MOUTH  DAILY   loratadine (CLARITIN) 10 MG tablet Take 10 mg by mouth daily.     metoprolol succinate (TOPROL-XL) 50 MG 24 hr tablet TAKE 1 TABLET BY MOUTH  DAILY WITH OR IMMEDIATELY  FOLLOWING A MEAL   Multiple Vitamin (MULTIVITAMIN) tablet Take 1 tablet by mouth every evening.    omeprazole (PRILOSEC) 20 MG capsule TAKE 1 CAPSULE BY MOUTH  DAILY   simvastatin (ZOCOR) 20 MG tablet TAKE 1 TABLET BY MOUTH  EVERY NIGHT AT BEDTIME   Current Facility-Administered Medications for the 12/05/18 encounter (Telemedicine) with Kristy Pain, MD  Medication   0.9 %  sodium chloride infusion     Allergies:   Ciprofloxacin and Levofloxacin   Social History   Tobacco Use   Smoking status: Never Smoker   Smokeless tobacco: Never Used  Substance Use Topics   Alcohol use: No    Alcohol/week: 0.0 standard drinks   Drug use: No     Family Hx: The patient's family history includes ALS in her unknown relative; Arthritis in her unknown  relative; Cancer in her mother; Colon cancer (age of onset: 48) in her mother; Colon cancer (age of onset: 44) in her paternal aunt; Heart attack in her father; Heart disease in her father and unknown relative; Hyperlipidemia in her unknown relative; Hypertension in her father, mother, and unknown relative; Mental illness in her unknown relative. There is no history of Stomach cancer or Breast cancer.  ROS:   Please see the history of present illness.    Denies any fevers chills nausea vomiting syncope bleeding All other systems reviewed and are negative.   Prior CV studies:   The following studies were reviewed today:  07/13/12-48 hour Holter monitor-occasional PACs, 6 beats NSVT at 4 AM, asymptomatic  02/01/2018 event monitor:  Occasional PVCs, 3000, occasional PACs 3000, overall less than 1%  No atrial fibrillation, no adverse  arrhythmias  Chest [name]/other symptoms associated with sinus rhythm.  Overall reassuring monitor.   EKG: 09/04/17 - NSR, NSSTW abnormality. 07/23/14 - NSR 66, no other changes.  Normal rhythm, 71, no other abnormalities, normal QT  Echocardiogram: 06/19/13-normal ejection fraction  Labs/Other Tests and Data Reviewed:    EKG:  Prior EKG from 09/04/2017 personally reviewed showed sinus rhythm with nonspecific ST-T wave changes  Recent Labs: 01/25/2018: BUN 6; Creatinine, Ser 0.71; Hemoglobin 12.8; Platelets 243; Potassium 3.6; Sodium 138   Recent Lipid Panel Lab Results  Component Value Date/Time   CHOL 162 09/15/2017 09:05 AM   TRIG 69.0 09/15/2017 09:05 AM   TRIG 61 05/17/2006 09:04 AM   HDL 57.60 09/15/2017 09:05 AM   CHOLHDL 3 09/15/2017 09:05 AM   LDLCALC 90 09/15/2017 09:05 AM   LDLDIRECT 132.6 06/18/2010 08:01 AM    Wt Readings from Last 3 Encounters:  12/05/18 118 lb 8 oz (53.8 kg)  01/31/18 121 lb 6.4 oz (55.1 kg)  01/25/18 120 lb (54.4 kg)     Objective:    Vital Signs:  BP 118/63    Pulse 60    Ht 5\' 3"  (1.6 m)    Wt 118 lb 8 oz  (53.8 kg)    BMI 20.99 kg/m    VITAL SIGNS:  reviewed GEN:  no acute distress EYES:  sclerae anicteric, EOMI - Extraocular Movements Intact RESPIRATORY:  normal respiratory effort, symmetric expansion SKIN:  no rash, lesions or ulcers. MUSCULOSKELETAL:  no obvious deformities. NEURO:  alert and oriented x 3, no obvious focal deficit PSYCH:  normal affect  ASSESSMENT & PLAN:    Atypical chest [name] - Fleeting discomfort that accompanied several trips to the bathroom.  She was having a off and on all day.  Eventually they resolved. -No further occurrences.  She will keep an eye on this.  Sounds noncardiac. Treadmill test showed no signs of ischemia  PVCs PACs palpitations - Confirmed on event monitor less than 1% of total beats.  Continue with Toprol.  Reassurance.  No atrial fibrillation.  She is still enjoying chocolate occasionally.  Knows not to take Sudafed.  Hyperlipidemia -Continue with statin.  No myalgias  COVID-19 Education: The signs and symptoms of COVID-19 were discussed with the patient and how to seek care for testing (follow up with PCP or arrange E-visit).  The importance of social distancing was discussed today.  Time:   Today, I have spent 12 minutes with the patient with telehealth technology discussing the above problems.     Medication Adjustments/Labs and Tests Ordered: Current medicines are reviewed at length with the patient today.  Concerns regarding medicines are outlined above.   Tests Ordered: No orders of the defined types were placed in this encounter.   Medication Changes: No orders of the defined types were placed in this encounter.   Disposition:  Follow up in 1 year(s)  Signed, Kristy Furbish, MD  12/05/2018 12:11 PM    [facility]

## 2018-12-25 ENCOUNTER — Other Ambulatory Visit: Payer: Self-pay | Admitting: Family Medicine

## 2019-01-08 ENCOUNTER — Encounter: Payer: Self-pay | Admitting: Family Medicine

## 2019-01-22 ENCOUNTER — Other Ambulatory Visit: Payer: Self-pay | Admitting: Obstetrics and Gynecology

## 2019-01-22 ENCOUNTER — Other Ambulatory Visit (HOSPITAL_COMMUNITY)
Admission: RE | Admit: 2019-01-22 | Discharge: 2019-01-22 | Disposition: A | Discharge status: Home / Self Care | Payer: Medicare Other | Source: Ambulatory Visit | Attending: Obstetrics and Gynecology | Admitting: Obstetrics and Gynecology

## 2019-01-22 DIAGNOSIS — Z01419 Encounter for gynecological examination (general) (routine) without abnormal findings: Secondary | ICD-10-CM | POA: Diagnosis present

## 2019-01-25 LAB — CYTOLOGY - PAP
Diagnosis: NEGATIVE
HPV: NOT DETECTED

## 2019-02-01 ENCOUNTER — Telehealth: Payer: Self-pay | Admitting: Family Medicine

## 2019-02-01 NOTE — Telephone Encounter (Signed)
Tried to call pt to schedule but had to leave a message

## 2019-02-01 NOTE — Telephone Encounter (Signed)
Copied from [place] [phone]. Topic: Appointment Scheduling - Scheduling Inquiry for Clinic >> Jan 31, 2019  2:18 PM Richardo Priest, [place] wrote: Reason for CRM: Patient called in stating they are needing a physical scheduled. Please advise and call back.

## 2019-02-14 ENCOUNTER — Other Ambulatory Visit: Payer: Self-pay | Admitting: Obstetrics and Gynecology

## 2019-02-14 DIAGNOSIS — Z1231 Encounter for screening mammogram for malignant neoplasm of breast: Secondary | ICD-10-CM

## 2019-03-06 ENCOUNTER — Ambulatory Visit (INDEPENDENT_AMBULATORY_CARE_PROVIDER_SITE_OTHER): Payer: Medicare Other | Admitting: Family Medicine

## 2019-03-06 ENCOUNTER — Encounter: Payer: Self-pay | Admitting: Family Medicine

## 2019-03-06 ENCOUNTER — Other Ambulatory Visit: Payer: Self-pay

## 2019-03-06 VITALS — BP 140/80 | HR 73 | Temp 97.7°F | Wt 117.3 lb

## 2019-03-06 DIAGNOSIS — Z0000 Encounter for general adult medical examination without abnormal findings: Secondary | ICD-10-CM

## 2019-03-06 DIAGNOSIS — E039 Hypothyroidism, unspecified: Secondary | ICD-10-CM

## 2019-03-06 DIAGNOSIS — Z23 Encounter for immunization: Secondary | ICD-10-CM

## 2019-03-06 LAB — CBC WITH DIFFERENTIAL/PLATELET
Basophils Absolute: 0 10*3/uL (ref 0.0–0.1)
Basophils Relative: 0.5 % (ref 0.0–3.0)
Eosinophils Absolute: 0.1 10*3/uL (ref 0.0–0.7)
Eosinophils Relative: 2.3 % (ref 0.0–5.0)
HCT: 39.8 % (ref 36.0–46.0)
Hemoglobin: 13.4 g/dL (ref 12.0–15.0)
Lymphocytes Relative: 38.3 % (ref 12.0–46.0)
Lymphs Abs: 2.1 10*3/uL (ref 0.7–4.0)
MCHC: 33.6 g/dL (ref 30.0–36.0)
MCV: 95.4 fl (ref 78.0–100.0)
Monocytes Absolute: 0.5 10*3/uL (ref 0.1–1.0)
Monocytes Relative: 9.1 % (ref 3.0–12.0)
Neutro Abs: 2.7 10*3/uL (ref 1.4–7.7)
Neutrophils Relative %: 49.8 % (ref 43.0–77.0)
Platelets: 245 10*3/uL (ref 150.0–400.0)
RBC: 4.18 Mil/uL (ref 3.87–5.11)
RDW: 13.5 % (ref 11.5–15.5)
WBC: 5.5 10*3/uL (ref 4.0–10.5)

## 2019-03-06 LAB — HEPATIC FUNCTION PANEL
ALT: 14 U/L (ref 0–35)
AST: 18 U/L (ref 0–37)
Albumin: 4.6 g/dL (ref 3.5–5.2)
Alkaline Phosphatase: 32 U/L — ABNORMAL LOW (ref 39–117)
Bilirubin, Direct: 0.1 mg/dL (ref 0.0–0.3)
Total Bilirubin: 0.5 mg/dL (ref 0.2–1.2)
Total Protein: 7.3 g/dL (ref 6.0–8.3)

## 2019-03-06 LAB — BASIC METABOLIC PANEL
BUN: 11 mg/dL (ref 6–23)
CO2: 28 mEq/L (ref 19–32)
Calcium: 9.5 mg/dL (ref 8.4–10.5)
Chloride: 102 mEq/L (ref 96–112)
Creatinine, Ser: 0.65 mg/dL (ref 0.40–1.20)
GFR: 91.16 mL/min (ref >60.00)
Glucose, Bld: 78 mg/dL (ref 70–99)
Potassium: 3.9 mEq/L (ref 3.5–5.1)
Sodium: 138 mEq/L (ref 135–145)

## 2019-03-06 LAB — LIPID PANEL
Cholesterol: 177 mg/dL (ref 0–200)
HDL: 62.6 mg/dL (ref >39.00)
LDL Cholesterol: 100 mg/dL — ABNORMAL HIGH (ref 0–99)
NonHDL: 114.89
Total CHOL/HDL Ratio: 3
Triglycerides: 75 mg/dL (ref 0.0–149.0)
VLDL: 15 mg/dL (ref 0.0–40.0)

## 2019-03-06 LAB — TSH: TSH: 2.05 u[IU]/mL (ref 0.35–4.50)

## 2019-03-06 LAB — T4, FREE: Free T4: 0.89 ng/dL (ref 0.60–1.60)

## 2019-03-06 LAB — T3, FREE: T3, Free: 3 pg/mL (ref 2.3–4.2)

## 2019-03-06 NOTE — Progress Notes (Signed)
   Subjective:    Patient ID: Kristy [name], female    DOB: 1950-07-24, 68 y.o.   MRN: [id number]  HPI Here for a well exam. She feels fine. Her recent UTI has resolved.    Review of Systems  Constitutional: Negative.   HENT: Negative.   Eyes: Negative.   Respiratory: Negative.   Cardiovascular: Negative.   Gastrointestinal: Negative.   Genitourinary: Negative for decreased urine volume, difficulty urinating, dyspareunia, dysuria, enuresis, flank pain, frequency, hematuria, pelvic pain and urgency.  Musculoskeletal: Negative.   Skin: Negative.   Neurological: Negative.   Psychiatric/Behavioral: Negative.        Objective:   Physical Exam Constitutional:      General: She is not in acute distress.    Appearance: She is well-developed.  HENT:     Head: Normocephalic and atraumatic.     Right Ear: External ear normal.     Left Ear: External ear normal.     Nose: Nose normal.     Mouth/Throat:     Pharynx: No oropharyngeal exudate.  Eyes:     General: No scleral icterus.    Conjunctiva/sclera: Conjunctivae normal.     Pupils: Pupils are equal, round, and reactive to light.  Neck:     Musculoskeletal: Normal range of motion and neck supple.     Thyroid: No thyromegaly.     Vascular: No JVD.  Cardiovascular:     Rate and Rhythm: Normal rate and regular rhythm.     Heart sounds: Normal heart sounds. No murmur. No friction rub. No gallop.   Pulmonary:     Effort: Pulmonary effort is normal. No respiratory distress.     Breath sounds: Normal breath sounds. No wheezing or rales.  Chest:     Chest wall: No tenderness.  Abdominal:     General: Bowel sounds are normal. There is no distension.     Palpations: Abdomen is soft. There is no mass.     Tenderness: There is no abdominal tenderness. There is no guarding or rebound.  Musculoskeletal: Normal range of motion.        General: No tenderness.  Lymphadenopathy:     Cervical: No cervical adenopathy.  Skin:    General:  Skin is warm and dry.     Findings: No erythema or rash.  Neurological:     Mental Status: She is alert and oriented to person, place, and time.     Cranial Nerves: No cranial nerve deficit.     Motor: No abnormal muscle tone.     Coordination: Coordination normal.     Deep Tendon Reflexes: Reflexes are normal and symmetric. Reflexes normal.  Psychiatric:        Behavior: Behavior normal.        Thought Content: Thought content normal.        Judgment: Judgment normal.           Assessment & Plan:  Well exam. We discussed diet and exercise. Get fasting labs.  Alysia Penna, MD

## 2019-03-06 NOTE — Addendum Note (Signed)
Addended by: Rebecca Eaton on: 03/06/2019 11:06 AM   Modules accepted: Orders

## 2019-03-11 ENCOUNTER — Ambulatory Visit: Payer: Medicare Other | Admitting: Cardiology

## 2019-03-29 ENCOUNTER — Ambulatory Visit
Admission: RE | Admit: 2019-03-29 | Discharge: 2019-03-29 | Disposition: A | Discharge status: Home / Self Care | Payer: Medicare Other | Source: Ambulatory Visit | Attending: Obstetrics and Gynecology | Admitting: Obstetrics and Gynecology

## 2019-03-29 ENCOUNTER — Other Ambulatory Visit: Payer: Self-pay

## 2019-03-29 DIAGNOSIS — Z1231 Encounter for screening mammogram for malignant neoplasm of breast: Secondary | ICD-10-CM

## 2019-06-11 ENCOUNTER — Other Ambulatory Visit: Payer: Self-pay | Admitting: Family Medicine

## 2019-07-25 ENCOUNTER — Ambulatory Visit: Payer: Medicare PPO

## 2019-08-03 ENCOUNTER — Ambulatory Visit: Payer: Medicare PPO

## 2019-08-07 ENCOUNTER — Ambulatory Visit: Payer: Medicare Other

## 2019-08-07 ENCOUNTER — Telehealth: Payer: Self-pay | Admitting: Family Medicine

## 2019-08-07 MED ORDER — OMEPRAZOLE 20 MG PO CPDR: 20.0000 mg | DELAYED_RELEASE_CAPSULE | Freq: Every day | ORAL | 0 refills | Status: DC

## 2019-08-07 MED ORDER — METOPROLOL SUCCINATE ER 50 MG PO TB24: ORAL_TABLET | ORAL | 0 refills | Status: DC

## 2019-08-07 MED ORDER — LEVOTHYROXINE SODIUM 50 MCG PO TABS: 50.0000 ug | ORAL_TABLET | Freq: Every day | ORAL | 3 refills | Status: DC

## 2019-08-07 MED ORDER — IBANDRONATE SODIUM 150 MG PO TABS: ORAL_TABLET | ORAL | 3 refills | Status: DC

## 2019-08-07 MED ORDER — SIMVASTATIN 20 MG PO TABS: 20.0000 mg | ORAL_TABLET | Freq: Every day | ORAL | 3 refills | Status: DC

## 2019-08-07 NOTE — Telephone Encounter (Signed)
Rx has been sent in. Patient is aware. 

## 2019-08-07 NOTE — Telephone Encounter (Signed)
Medication Refill: Levothyroxine, Simvastatin, Metoprplol, Omeprazole, Ibandronate Pharmacy: [facility]  Phone: [phone]

## 2019-09-03 ENCOUNTER — Encounter: Payer: Self-pay | Admitting: Family Medicine

## 2019-09-03 DIAGNOSIS — R300 Dysuria: Secondary | ICD-10-CM | POA: Diagnosis not present

## 2019-09-03 DIAGNOSIS — R350 Frequency of micturition: Secondary | ICD-10-CM | POA: Diagnosis not present

## 2019-09-03 DIAGNOSIS — N3001 Acute cystitis with hematuria: Secondary | ICD-10-CM | POA: Diagnosis not present

## 2019-10-15 DIAGNOSIS — L821 Other seborrheic keratosis: Secondary | ICD-10-CM | POA: Diagnosis not present

## 2019-11-22 DIAGNOSIS — R300 Dysuria: Secondary | ICD-10-CM | POA: Diagnosis not present

## 2019-11-22 DIAGNOSIS — N3001 Acute cystitis with hematuria: Secondary | ICD-10-CM | POA: Diagnosis not present

## 2019-11-22 DIAGNOSIS — R350 Frequency of micturition: Secondary | ICD-10-CM | POA: Diagnosis not present

## 2019-11-27 DIAGNOSIS — R350 Frequency of micturition: Secondary | ICD-10-CM | POA: Diagnosis not present

## 2019-11-27 DIAGNOSIS — N3941 Urge incontinence: Secondary | ICD-10-CM | POA: Diagnosis not present

## 2019-11-27 DIAGNOSIS — R3914 Feeling of incomplete bladder emptying: Secondary | ICD-10-CM | POA: Diagnosis not present

## 2019-12-05 ENCOUNTER — Other Ambulatory Visit: Payer: Self-pay | Admitting: Family Medicine

## 2019-12-05 MED ORDER — OMEPRAZOLE 20 MG PO CPDR: 20.0000 mg | DELAYED_RELEASE_CAPSULE | Freq: Every day | ORAL | 0 refills | Status: DC

## 2019-12-05 MED ORDER — METOPROLOL SUCCINATE ER 50 MG PO TB24: ORAL_TABLET | ORAL | 0 refills | Status: DC

## 2019-12-05 NOTE — Telephone Encounter (Signed)
These prescriptions have already been sent in. Left a detailed message on verified voice mail letting the patient know that her prescriptions should be at the pharmacy.

## 2019-12-05 NOTE — Telephone Encounter (Signed)
Message routed to PCP CMA  

## 2020-01-14 DIAGNOSIS — N3020 Other chronic cystitis without hematuria: Secondary | ICD-10-CM | POA: Diagnosis not present

## 2020-01-14 DIAGNOSIS — R3914 Feeling of incomplete bladder emptying: Secondary | ICD-10-CM | POA: Diagnosis not present

## 2020-01-14 DIAGNOSIS — N390 Urinary tract infection, site not specified: Secondary | ICD-10-CM | POA: Diagnosis not present

## 2020-01-22 DIAGNOSIS — S70362A Insect bite (nonvenomous), left thigh, initial encounter: Secondary | ICD-10-CM | POA: Diagnosis not present

## 2020-01-26 NOTE — Progress Notes (Signed)
Cardiology Office Note:    Date:  01/28/2020   ID:  Kristy [name], Kristy [name] 1951-03-28, MRN [id number]  PCP:  Laurey Morale, MD  [facility] Cardiologist:  Candee Furbish, MD  [facility] Electrophysiologist:  None   Referring MD: Laurey Morale, MD    History of Present Illness:    Kristy [name] is a 69 y.o. female here for follow-up of palpitations.  Early family history of CAD with father having 3 MIs in his 85s.  Prior episodes of vasovagal syncope during blood draws.  Retired Pharmacist, hospital retired in 2009.  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  Rare palps. Doing well.  Denies any fevers chills nausea vomiting syncope bleeding.  Past Medical History:  Diagnosis Date  . Allergy   . Blood in stool   . Cancer ([place]) 1998   breast  . GERD (gastroesophageal reflux disease)   . Gynecologic exam normal    sees Dr. Christophe Louis  . Hyperlipidemia   . Hypothyroidism   . Osteoporosis    last DEXA 08-20-15   . Personal history of radiation therapy   . Posterior vitreous detachment of left eye    sees Dr. Starling Manns     Past Surgical History:  Procedure Laterality Date  . BREAST LUMPECTOMY  1998   left  . COLONOSCOPY  12/05/2016   per Dr. Ardis Hughs, no polyps, repeat in 5 yrs (pos family hx)   . TONSILLECTOMY  1959    Current Medications: Current Meds  Medication Sig  . calcium-vitamin D (OSCAL WITH D) 500-200 MG-UNIT per tablet Take 1 tablet by mouth every evening.   . Cholecalciferol (VITAMIN D-3) 1000 UNITS CAPS Take 1,000 Units by mouth daily.  . fish oil-omega-3 fatty acids 1000 MG capsule Take 1 g by mouth daily.    . fluticasone (FLONASE) 50 MCG/ACT nasal spray Place 1 spray into both nostrils daily as needed for allergies.   Marland Kitchen ibandronate (BONIVA) 150 MG tablet TAKE 1 TABLET BY MOUTH ONCE MONTHLY  . levothyroxine (SYNTHROID) 50 MCG tablet Take 1 tablet (50 mcg total) by mouth daily.  Marland Kitchen loratadine (CLARITIN)  10 MG tablet Take 10 mg by mouth daily.    . metoprolol succinate (TOPROL-XL) 50 MG 24 hr tablet TAKE 1 TABLET BY MOUTH  DAILY WITH OR IMMEDIATELY  FOLLOWING A MEAL  . mirabegron ER (MYRBETRIQ) 50 MG TB24 tablet Take 50 mg by mouth daily.  . Multiple Vitamin (MULTIVITAMIN) tablet Take 1 tablet by mouth every evening.   Marland Kitchen omeprazole (PRILOSEC) 20 MG capsule Take 1 capsule (20 mg total) by mouth daily.  . simvastatin (ZOCOR) 20 MG tablet Take 1 tablet (20 mg total) by mouth at bedtime.   Current Facility-Administered Medications for the 01/28/20 encounter (Office Visit) with Jerline Pain, MD  Medication  . 0.9 %  sodium chloride infusion     Allergies:   Ciprofloxacin and Levofloxacin   Social History   Socioeconomic History  . Marital status: Married    Spouse name: Not on file  . Number of children: Not on file  . Years of education: Not on file  . Highest education level: Not on file  Occupational History  . Not on file  Tobacco Use  . Smoking status: Never Smoker  . Smokeless tobacco: Never Used  Substance and Sexual Activity  . Alcohol use: No    Alcohol/week: 0.0 standard drinks  . Drug use: No  . Sexual  activity: Not on file  Other Topics Concern  . Not on file  Social History Narrative  . Not on file   Social Determinants of Health   Financial Resource Strain:   . Difficulty of Paying Living Expenses:   Food Insecurity:   . Worried About Charity fundraiser in the Last Year:   . Arboriculturist in the Last Year:   Transportation Needs:   . Film/video editor (Medical):   Marland Kitchen Lack of Transportation (Non-Medical):   Physical Activity:   . Days of Exercise per Week:   . Minutes of Exercise per Session:   Stress:   . Feeling of Stress :   Social Connections:   . Frequency of Communication with Friends and Family:   . Frequency of Social Gatherings with Friends and Family:   . Attends Religious Services:   . Active Member of Clubs or Organizations:   .  Attends Archivist Meetings:   Marland Kitchen Marital Status:      Family History: The patient's family history includes ALS in an other family member; Arthritis in an other family member; Cancer in her mother; Colon cancer (age of onset: 53) in her mother; Colon cancer (age of onset: 74) in her paternal aunt; Heart attack in her father; Heart disease in her father and another family member; Hyperlipidemia in an other family member; Hypertension in her father, mother, and another family member; Mental illness in an other family member. There is no history of Stomach cancer or Breast cancer.  ROS:   Please see the history of present illness.     All other systems reviewed and are negative.  EKGs/Labs/Other Studies Reviewed:    The following studies were reviewed today: 07/13/12-48 hour Holter monitor-occasional PACs, 6 beats NSVT at 4 AM, asymptomatic  02/01/2018 event monitor:  Occasional PVCs, 3000, occasional PACs 3000, overall less than 1%  No atrial fibrillation, no adverse arrhythmias  Chest pain/other symptoms associated with sinus rhythm.  Overall reassuring monitor.  Echocardiogram: 06/19/13-normal ejection fraction  EKG:  EKG is  ordered today.  The ekg ordered today demonstrates NSR  61 --prior 09/04/17 - NSR, NSSTW abnormality. 07/23/14 - NSR 66, no other changes. Normal rhythm, 71, no other abnormalities, normal QT  Recent Labs: 03/06/2019: ALT 14; BUN 11; Creatinine, Ser 0.65; Hemoglobin 13.4; Platelets 245.0; Potassium 3.9; Sodium 138; TSH 2.05  Recent Lipid Panel    Component Value Date/Time   CHOL 177 03/06/2019 1104   TRIG 75.0 03/06/2019 1104   TRIG 61 05/17/2006 0904   HDL 62.60 03/06/2019 1104   CHOLHDL 3 03/06/2019 1104   VLDL 15.0 03/06/2019 1104   LDLCALC 100 (H) 03/06/2019 1104   LDLDIRECT 132.6 06/18/2010 0801    Physical Exam:    VS:  BP 130/66   Pulse 61   Ht 5\' 3"  (1.6 m)   Wt 121 lb 6.4 oz (55.1 kg)   SpO2 95%   BMI 21.51 kg/m     Wt  Readings from Last 3 Encounters:  01/28/20 121 lb 6.4 oz (55.1 kg)  03/06/19 117 lb 4.8 oz (53.2 kg)  12/05/18 118 lb 8 oz (53.8 kg)     GEN:  Well nourished, well developed in no acute distress HEENT: Normal NECK: No JVD; No carotid bruits LYMPHATICS: No lymphadenopathy CARDIAC: RRR, no murmurs, rubs, gallops RESPIRATORY:  Clear to auscultation without rales, wheezing or rhonchi  ABDOMEN: Soft, non-tender, non-distended MUSCULOSKELETAL:  No edema; No deformity  SKIN: Warm and  dry NEUROLOGIC:  Alert and oriented x 3 PSYCHIATRIC:  Normal affect   ASSESSMENT:    1. Palpitations   2. Pure hypercholesterolemia   3. Atypical chest pain    PLAN:    In order of problems listed above:  Atypical chest pain -Previous episodes of fleeting discomfort that accompanied several trips to the bathroom.  Eventually resolved.  No further recurrence.  Fairly noncardiac in origin. -Previous treadmill test showed no signs of ischemia.  Palpitations -PVCs and PACs confirmed on event monitor very rare.  Reassurance.  No atrial fibrillation.  On Toprol.  Occasionally enjoys chocolate.  Has comfort knowing that these are benign.  Hyperlipidemia -Statin, no myalgias.  On simvastatin 20 mg.  LDL 100.  Medication Adjustments/Labs and Tests Ordered: Current medicines are reviewed at length with the patient today.  Concerns regarding medicines are outlined above.  Orders Placed This Encounter  Procedures  . EKG 12-Lead   No orders of the defined types were placed in this encounter.   Patient Instructions  Medication Instructions:  The current medical regimen is effective;  continue present plan and medications.  *If you need a refill on your cardiac medications before your next appointment, please call your pharmacy*  Follow-Up: At [facility], you and your health needs are our priority.  As part of our continuing mission to provide you with exceptional heart care, we have created  designated Provider Care Teams.  These Care Teams include your primary Cardiologist (physician) and Advanced Practice Providers (APPs -  Physician Assistants and Nurse Practitioners) who all work together to provide you with the care you need, when you need it.  We recommend signing up for the patient portal called "MyChart".  Sign up information is provided on this After Visit Summary.  MyChart is used to connect with patients for Virtual Visits (Telemedicine).  Patients are able to view lab/test results, encounter notes, upcoming appointments, etc.  Non-urgent messages can be sent to your provider as well.   To learn more about what you can do with MyChart, go to [email or web address].    Your next appointment:   12 month(s)  The format for your next appointment:   In Person  Provider:   Candee Furbish, MD  Thank you for choosing [facility]!!         Signed, Candee Furbish, MD  01/28/2020 2:18 PM    [place]

## 2020-01-27 DIAGNOSIS — N8111 Cystocele, midline: Secondary | ICD-10-CM | POA: Diagnosis not present

## 2020-01-27 DIAGNOSIS — N814 Uterovaginal prolapse, unspecified: Secondary | ICD-10-CM | POA: Diagnosis not present

## 2020-01-27 DIAGNOSIS — Z01419 Encounter for gynecological examination (general) (routine) without abnormal findings: Secondary | ICD-10-CM | POA: Diagnosis not present

## 2020-01-28 ENCOUNTER — Ambulatory Visit: Payer: Medicare PPO | Admitting: Cardiology

## 2020-01-28 ENCOUNTER — Encounter: Payer: Self-pay | Admitting: Cardiology

## 2020-01-28 ENCOUNTER — Other Ambulatory Visit: Payer: Self-pay

## 2020-01-28 VITALS — BP 130/66 | HR 61 | Ht 63.0 in | Wt 121.4 lb

## 2020-01-28 DIAGNOSIS — E7800 Pure hypercholesterolemia, unspecified: Secondary | ICD-10-CM | POA: Diagnosis not present

## 2020-01-28 DIAGNOSIS — R002 Palpitations: Secondary | ICD-10-CM | POA: Diagnosis not present

## 2020-01-28 DIAGNOSIS — R0789 Other chest pain: Secondary | ICD-10-CM

## 2020-01-28 NOTE — Patient Instructions (Signed)
Medication Instructions:  The current medical regimen is effective;  continue present plan and medications.  *If you need a refill on your cardiac medications before your next appointment, please call your pharmacy*  Follow-Up: At [facility], you and your health needs are our priority.  As part of our continuing mission to provide you with exceptional heart care, we have created designated Provider Care Teams.  These Care Teams include your primary Cardiologist (physician) and Advanced Practice Providers (APPs -  Physician Assistants and Nurse Practitioners) who all work together to provide you with the care you need, when you need it.  We recommend signing up for the patient portal called "MyChart".  Sign up information is provided on this After Visit Summary.  MyChart is used to connect with patients for Virtual Visits (Telemedicine).  Patients are able to view lab/test results, encounter notes, upcoming appointments, etc.  Non-urgent messages can be sent to your provider as well.   To learn more about what you can do with MyChart, go to [email or web address].    Your next appointment:   12 month(s)  The format for your next appointment:   In Person  Provider:   Mark Skains, MD   Thank you for choosing [facility]!!      

## 2020-02-04 ENCOUNTER — Ambulatory Visit: Payer: Medicare PPO | Admitting: Family Medicine

## 2020-02-04 ENCOUNTER — Other Ambulatory Visit: Payer: Self-pay

## 2020-02-04 ENCOUNTER — Encounter: Payer: Self-pay | Admitting: Family Medicine

## 2020-02-04 VITALS — BP 130/62 | HR 85 | Temp 98.7°F | Wt 120.8 lb

## 2020-02-04 DIAGNOSIS — R350 Frequency of micturition: Secondary | ICD-10-CM

## 2020-02-04 DIAGNOSIS — N390 Urinary tract infection, site not specified: Secondary | ICD-10-CM

## 2020-02-04 LAB — POCT URINALYSIS DIPSTICK
Bilirubin, UA: NEGATIVE
Blood, UA: POSITIVE
Glucose, UA: NEGATIVE
Ketones, UA: NEGATIVE
Nitrite, UA: NEGATIVE
Protein, UA: NEGATIVE
Spec Grav, UA: 1.01 (ref 1.010–1.025)
Urobilinogen, UA: 0.2 E.U./dL
pH, UA: 6.5 (ref 5.0–8.0)

## 2020-02-04 MED ORDER — NITROFURANTOIN MONOHYD MACRO 100 MG PO CAPS: 100.0000 mg | ORAL_CAPSULE | Freq: Two times a day (BID) | ORAL | 0 refills | Status: DC

## 2020-02-04 NOTE — Progress Notes (Signed)
° °  Subjective:    Patient ID: Sybil Shrader, female    DOB: Feb 13, 1951, 69 y.o.   MRN: [id number]  HPI Here for 2 days of urinary urgency and burning. No fever.    Review of Systems  Constitutional: Negative.   Respiratory: Negative.   Cardiovascular: Negative.   Genitourinary: Positive for dysuria, frequency and urgency. Negative for flank pain and hematuria.       Objective:   Physical Exam Constitutional:      Appearance: Normal appearance.  Cardiovascular:     Rate and Rhythm: Normal rate and regular rhythm.     Pulses: Normal pulses.     Heart sounds: Normal heart sounds.  Pulmonary:     Effort: Pulmonary effort is normal.     Breath sounds: Normal breath sounds.  Neurological:     Mental Status: She is alert.           Assessment & Plan:  UTI, treat with Macrobid. Culture the sample.  Alysia Penna, MD

## 2020-02-18 ENCOUNTER — Other Ambulatory Visit: Payer: Self-pay | Admitting: Obstetrics and Gynecology

## 2020-02-18 DIAGNOSIS — Z1231 Encounter for screening mammogram for malignant neoplasm of breast: Secondary | ICD-10-CM

## 2020-02-19 DIAGNOSIS — H2513 Age-related nuclear cataract, bilateral: Secondary | ICD-10-CM | POA: Diagnosis not present

## 2020-02-19 DIAGNOSIS — H04123 Dry eye syndrome of bilateral lacrimal glands: Secondary | ICD-10-CM | POA: Diagnosis not present

## 2020-02-19 DIAGNOSIS — H5213 Myopia, bilateral: Secondary | ICD-10-CM | POA: Diagnosis not present

## 2020-02-20 DIAGNOSIS — L0889 Other specified local infections of the skin and subcutaneous tissue: Secondary | ICD-10-CM | POA: Diagnosis not present

## 2020-02-20 DIAGNOSIS — D225 Melanocytic nevi of trunk: Secondary | ICD-10-CM | POA: Diagnosis not present

## 2020-02-20 DIAGNOSIS — D1801 Hemangioma of skin and subcutaneous tissue: Secondary | ICD-10-CM | POA: Diagnosis not present

## 2020-02-20 DIAGNOSIS — L814 Other melanin hyperpigmentation: Secondary | ICD-10-CM | POA: Diagnosis not present

## 2020-02-20 DIAGNOSIS — L738 Other specified follicular disorders: Secondary | ICD-10-CM | POA: Diagnosis not present

## 2020-02-20 DIAGNOSIS — L821 Other seborrheic keratosis: Secondary | ICD-10-CM | POA: Diagnosis not present

## 2020-03-05 ENCOUNTER — Other Ambulatory Visit: Payer: Self-pay

## 2020-03-05 DIAGNOSIS — M7981 Nontraumatic hematoma of soft tissue: Secondary | ICD-10-CM | POA: Diagnosis not present

## 2020-03-05 DIAGNOSIS — L738 Other specified follicular disorders: Secondary | ICD-10-CM | POA: Diagnosis not present

## 2020-03-06 ENCOUNTER — Ambulatory Visit (INDEPENDENT_AMBULATORY_CARE_PROVIDER_SITE_OTHER): Payer: Medicare PPO | Admitting: Family Medicine

## 2020-03-06 ENCOUNTER — Other Ambulatory Visit: Payer: Self-pay

## 2020-03-06 ENCOUNTER — Encounter: Payer: Self-pay | Admitting: Family Medicine

## 2020-03-06 VITALS — BP 120/76 | HR 70 | Temp 98.6°F | Ht 63.0 in | Wt 116.0 lb

## 2020-03-06 DIAGNOSIS — Z23 Encounter for immunization: Secondary | ICD-10-CM

## 2020-03-06 DIAGNOSIS — E039 Hypothyroidism, unspecified: Secondary | ICD-10-CM

## 2020-03-06 DIAGNOSIS — Z0000 Encounter for general adult medical examination without abnormal findings: Secondary | ICD-10-CM

## 2020-03-06 MED ORDER — OMEPRAZOLE 20 MG PO CPDR
20.0000 mg | DELAYED_RELEASE_CAPSULE | Freq: Every day | ORAL | 3 refills | Status: DC
Start: 2020-03-06 — End: 2021-03-02

## 2020-03-06 MED ORDER — METOPROLOL SUCCINATE ER 50 MG PO TB24: ORAL_TABLET | ORAL | 3 refills | Status: DC

## 2020-03-06 NOTE — Addendum Note (Signed)
Addended by: Wyvonne Lenz on: 03/06/2020 12:05 PM   Modules accepted: Orders

## 2020-03-06 NOTE — Progress Notes (Signed)
   Subjective:    Patient ID: Kristy [name], female    DOB: October 01, 1950, 69 y.o.   MRN: [id number]  HPI Here for a well exam. She feels fine. We recently treated her for a UTI and this has resolved. She also had a MRSA infection on the abdomen recently, and she saw Dr. Jarome Matin for this. This resolved after treatment with Doxycycline. She and her husband plan to move to [place], [place] in the next 6 months to be near their son.    Review of Systems  Constitutional: Negative.   HENT: Negative.   Eyes: Negative.   Respiratory: Negative.   Cardiovascular: Negative.   Gastrointestinal: Negative.   Genitourinary: Negative for decreased urine volume, difficulty urinating, dyspareunia, dysuria, enuresis, flank pain, frequency, hematuria, pelvic pain and urgency.  Musculoskeletal: Negative.   Skin: Negative.   Neurological: Negative.   Psychiatric/Behavioral: Negative.        Objective:   Physical Exam Constitutional:      General: She is not in acute distress.    Appearance: She is well-developed.  HENT:     Head: Normocephalic and atraumatic.     Right Ear: External ear normal.     Left Ear: External ear normal.     Nose: Nose normal.     Mouth/Throat:     Pharynx: No oropharyngeal exudate.  Eyes:     General: No scleral icterus.    Conjunctiva/sclera: Conjunctivae normal.     Pupils: Pupils are equal, round, and reactive to light.  Neck:     Thyroid: No thyromegaly.     Vascular: No JVD.  Cardiovascular:     Rate and Rhythm: Normal rate and regular rhythm.     Heart sounds: Normal heart sounds. No murmur heard.  No friction rub. No gallop.   Pulmonary:     Effort: Pulmonary effort is normal. No respiratory distress.     Breath sounds: Normal breath sounds. No wheezing or rales.  Chest:     Chest wall: No tenderness.  Abdominal:     General: Bowel sounds are normal. There is no distension.     Palpations: Abdomen is soft. There is no mass.     Tenderness: There is  no abdominal tenderness. There is no guarding or rebound.  Musculoskeletal:        General: No tenderness. Normal range of motion.     Cervical back: Normal range of motion and neck supple.  Lymphadenopathy:     Cervical: No cervical adenopathy.  Skin:    General: Skin is warm and dry.     Findings: No erythema or rash.  Neurological:     Mental Status: She is alert and oriented to person, place, and time.     Cranial Nerves: No cranial nerve deficit.     Motor: No abnormal muscle tone.     Coordination: Coordination normal.     Deep Tendon Reflexes: Reflexes are normal and symmetric. Reflexes normal.  Psychiatric:        Behavior: Behavior normal.        Thought Content: Thought content normal.        Judgment: Judgment normal.           Assessment & Plan:  Well exam. We discussed diet and exercise. Get fasting labs.  Alysia Penna, MD

## 2020-03-07 LAB — CBC WITH DIFFERENTIAL/PLATELET
Absolute Monocytes: 494 cells/uL (ref 200–950)
Basophils Absolute: 42 cells/uL (ref 0–200)
Basophils Relative: 0.8 %
Eosinophils Absolute: 130 cells/uL (ref 15–500)
Eosinophils Relative: 2.5 %
HCT: 39.9 % (ref 35.0–45.0)
Hemoglobin: 13.3 g/dL (ref 11.7–15.5)
Lymphs Abs: 2059 cells/uL (ref 850–3900)
MCH: 31.5 pg (ref 27.0–33.0)
MCHC: 33.3 g/dL (ref 32.0–36.0)
MCV: 94.5 fL (ref 80.0–100.0)
MPV: 11.2 fL (ref 7.5–12.5)
Monocytes Relative: 9.5 %
Neutro Abs: 2475 cells/uL (ref 1500–7800)
Neutrophils Relative %: 47.6 %
Platelets: 273 10*3/uL (ref 140–400)
RBC: 4.22 10*6/uL (ref 3.80–5.10)
RDW: 12.1 % (ref 11.0–15.0)
Total Lymphocyte: 39.6 %
WBC: 5.2 10*3/uL (ref 3.8–10.8)

## 2020-03-07 LAB — BASIC METABOLIC PANEL
BUN: 10 mg/dL (ref 7–25)
CO2: 30 mmol/L (ref 20–32)
Calcium: 9.8 mg/dL (ref 8.6–10.4)
Chloride: 103 mmol/L (ref 98–110)
Creat: 0.71 mg/dL (ref 0.50–0.99)
Glucose, Bld: 89 mg/dL (ref 65–99)
Potassium: 4.6 mmol/L (ref 3.5–5.3)
Sodium: 140 mmol/L (ref 135–146)

## 2020-03-07 LAB — HEPATIC FUNCTION PANEL
AG Ratio: 1.8 (calc) (ref 1.0–2.5)
ALT: 13 U/L (ref 6–29)
AST: 19 U/L (ref 10–35)
Albumin: 4.4 g/dL (ref 3.6–5.1)
Alkaline phosphatase (APISO): 34 U/L — ABNORMAL LOW (ref 37–153)
Bilirubin, Direct: 0.1 mg/dL (ref 0.0–0.2)
Globulin: 2.4 g/dL (calc) (ref 1.9–3.7)
Indirect Bilirubin: 0.4 mg/dL (calc) (ref 0.2–1.2)
Total Bilirubin: 0.5 mg/dL (ref 0.2–1.2)
Total Protein: 6.8 g/dL (ref 6.1–8.1)

## 2020-03-07 LAB — LIPID PANEL
Cholesterol: 184 mg/dL (ref <200)
HDL: 72 mg/dL (ref >=50)
LDL Cholesterol (Calc): 95 mg/dL (calc)
Non-HDL Cholesterol (Calc): 112 mg/dL (calc) (ref <130)
Total CHOL/HDL Ratio: 2.6 (calc) (ref <5.0)
Triglycerides: 83 mg/dL (ref <150)

## 2020-03-07 LAB — T3, FREE: T3, Free: 3.4 pg/mL (ref 2.3–4.2)

## 2020-03-07 LAB — T4, FREE: Free T4: 1.3 ng/dL (ref 0.8–1.8)

## 2020-03-07 LAB — TSH: TSH: 1.56 mIU/L (ref 0.40–4.50)

## 2020-03-11 ENCOUNTER — Telehealth: Payer: Self-pay | Admitting: Family Medicine

## 2020-03-11 NOTE — Telephone Encounter (Signed)
Left message for patient to schedule Annual Wellness Visit.  Please schedule with Nurse Health Advisor Shannon Crews, RN at [facility]  

## 2020-03-31 ENCOUNTER — Ambulatory Visit
Admission: RE | Admit: 2020-03-31 | Discharge: 2020-03-31 | Disposition: A | Discharge status: Home / Self Care | Payer: Medicare PPO | Source: Ambulatory Visit | Attending: Obstetrics and Gynecology | Admitting: Obstetrics and Gynecology

## 2020-03-31 ENCOUNTER — Other Ambulatory Visit: Payer: Self-pay

## 2020-03-31 DIAGNOSIS — Z1231 Encounter for screening mammogram for malignant neoplasm of breast: Secondary | ICD-10-CM

## 2020-03-31 HISTORY — DX: Malignant neoplasm of unspecified site of unspecified female breast: C50.919

## 2020-04-14 DIAGNOSIS — L738 Other specified follicular disorders: Secondary | ICD-10-CM | POA: Diagnosis not present

## 2020-04-14 DIAGNOSIS — L308 Other specified dermatitis: Secondary | ICD-10-CM | POA: Diagnosis not present

## 2020-05-12 DIAGNOSIS — R8290 Unspecified abnormal findings in urine: Secondary | ICD-10-CM | POA: Diagnosis not present

## 2020-05-12 DIAGNOSIS — R350 Frequency of micturition: Secondary | ICD-10-CM | POA: Diagnosis not present

## 2020-05-12 DIAGNOSIS — R339 Retention of urine, unspecified: Secondary | ICD-10-CM | POA: Diagnosis not present

## 2020-05-12 DIAGNOSIS — R3915 Urgency of urination: Secondary | ICD-10-CM | POA: Diagnosis not present

## 2020-06-14 DIAGNOSIS — R300 Dysuria: Secondary | ICD-10-CM | POA: Diagnosis not present

## 2020-06-14 DIAGNOSIS — N390 Urinary tract infection, site not specified: Secondary | ICD-10-CM | POA: Diagnosis not present

## 2020-07-08 DIAGNOSIS — N3941 Urge incontinence: Secondary | ICD-10-CM | POA: Diagnosis not present

## 2020-07-08 DIAGNOSIS — N812 Incomplete uterovaginal prolapse: Secondary | ICD-10-CM | POA: Diagnosis not present

## 2020-07-08 DIAGNOSIS — N952 Postmenopausal atrophic vaginitis: Secondary | ICD-10-CM | POA: Diagnosis not present

## 2020-07-15 ENCOUNTER — Telehealth: Payer: Self-pay | Admitting: Family Medicine

## 2020-07-15 NOTE — Telephone Encounter (Signed)
Left message for patient to call back and schedule Medicare Annual Wellness Visit (AWV) either virtually or in office.   Last AWV no information please schedule at anytime with [facility] Nurse Health Advisor 1 or 2   This should be a 45 minute visit. 

## 2020-07-29 DIAGNOSIS — R39198 Other difficulties with micturition: Secondary | ICD-10-CM | POA: Diagnosis not present

## 2020-07-29 DIAGNOSIS — E079 Disorder of thyroid, unspecified: Secondary | ICD-10-CM | POA: Diagnosis not present

## 2020-07-29 DIAGNOSIS — Z853 Personal history of malignant neoplasm of breast: Secondary | ICD-10-CM | POA: Diagnosis not present

## 2020-07-29 DIAGNOSIS — Z91048 Other nonmedicinal substance allergy status: Secondary | ICD-10-CM | POA: Diagnosis not present

## 2020-07-29 DIAGNOSIS — Z923 Personal history of irradiation: Secondary | ICD-10-CM | POA: Diagnosis not present

## 2020-07-29 DIAGNOSIS — R339 Retention of urine, unspecified: Secondary | ICD-10-CM | POA: Diagnosis not present

## 2020-07-29 DIAGNOSIS — E785 Hyperlipidemia, unspecified: Secondary | ICD-10-CM | POA: Diagnosis not present

## 2020-07-29 DIAGNOSIS — Z881 Allergy status to other antibiotic agents status: Secondary | ICD-10-CM | POA: Diagnosis not present

## 2020-07-29 DIAGNOSIS — Z888 Allergy status to other drugs, medicaments and biological substances status: Secondary | ICD-10-CM | POA: Diagnosis not present

## 2020-07-29 DIAGNOSIS — K219 Gastro-esophageal reflux disease without esophagitis: Secondary | ICD-10-CM | POA: Diagnosis not present

## 2020-07-31 DIAGNOSIS — K219 Gastro-esophageal reflux disease without esophagitis: Secondary | ICD-10-CM | POA: Diagnosis not present

## 2020-07-31 DIAGNOSIS — N8111 Cystocele, midline: Secondary | ICD-10-CM | POA: Diagnosis not present

## 2020-07-31 DIAGNOSIS — N813 Complete uterovaginal prolapse: Secondary | ICD-10-CM | POA: Diagnosis not present

## 2020-07-31 DIAGNOSIS — Z881 Allergy status to other antibiotic agents status: Secondary | ICD-10-CM | POA: Diagnosis not present

## 2020-07-31 DIAGNOSIS — E039 Hypothyroidism, unspecified: Secondary | ICD-10-CM | POA: Diagnosis not present

## 2020-07-31 DIAGNOSIS — Z87440 Personal history of urinary (tract) infections: Secondary | ICD-10-CM | POA: Diagnosis not present

## 2020-07-31 DIAGNOSIS — N888 Other specified noninflammatory disorders of cervix uteri: Secondary | ICD-10-CM | POA: Diagnosis not present

## 2020-07-31 DIAGNOSIS — N3941 Urge incontinence: Secondary | ICD-10-CM | POA: Diagnosis not present

## 2020-07-31 DIAGNOSIS — E785 Hyperlipidemia, unspecified: Secondary | ICD-10-CM | POA: Diagnosis not present

## 2020-07-31 DIAGNOSIS — Z853 Personal history of malignant neoplasm of breast: Secondary | ICD-10-CM | POA: Diagnosis not present

## 2020-07-31 DIAGNOSIS — I10 Essential (primary) hypertension: Secondary | ICD-10-CM | POA: Diagnosis not present

## 2020-07-31 HISTORY — PX: ABDOMINAL HYSTERECTOMY: SHX81

## 2020-09-01 ENCOUNTER — Other Ambulatory Visit: Payer: Self-pay | Admitting: Family Medicine

## 2020-09-14 ENCOUNTER — Telehealth: Payer: Self-pay | Admitting: Family Medicine

## 2020-09-14 NOTE — Telephone Encounter (Signed)
Left message for patient to call back and schedule Medicare Annual Wellness Visit (AWV) either virtually or in office. No detailed message   AWVI  please schedule at anytime with [facility] Nurse Health Advisor 1 or 2   This should be a 45 minute visit. 

## 2020-09-29 ENCOUNTER — Other Ambulatory Visit: Payer: Self-pay

## 2020-09-29 ENCOUNTER — Ambulatory Visit (INDEPENDENT_AMBULATORY_CARE_PROVIDER_SITE_OTHER): Payer: Medicare PPO

## 2020-09-29 DIAGNOSIS — Z0000 Encounter for general adult medical examination without abnormal findings: Secondary | ICD-10-CM | POA: Diagnosis not present

## 2020-09-29 NOTE — Patient Instructions (Addendum)
Kristy [name] , Thank you for taking time to come for your Medicare Wellness Visit. I appreciate your ongoing commitment to your health goals. Please review the following plan we discussed and let me know if I can assist you in the future.   Screening recommendations/referrals: Colonoscopy: Done 12/05/16 Mammogram: Done 03/31/20 Bone Density: Done 09/15/17 Recommended yearly ophthalmology/optometry visit for glaucoma screening and checkup Recommended yearly dental visit for hygiene and checkup  Vaccinations: Influenza vaccine: Up to date Pneumococcal vaccine: Up to date Tdap vaccine: Up to date Shingles vaccine: Shingrix discussed. Please contact your pharmacy for coverage information.    Covid-19:Completed 1/28, 2/25, & 03/31/20  Advanced directives: Please bring a copy of your health care power of attorney and living will to the office at your convenience.  Conditions/risks identified: None at this time  Next appointment: Follow up in one year for your annual wellness visit      Preventive Care 65 Years and Older, Female Preventive care refers to lifestyle choices and visits with your health care provider that can promote health and wellness. What does preventive care include?  A yearly physical exam. This is also called an annual well check.  Dental exams once or twice a year.  Routine eye exams. Ask your health care provider how often you should have your eyes checked.  Personal lifestyle choices, including:  Daily care of your teeth and gums.  Regular physical activity.  Eating a healthy diet.  Avoiding tobacco and drug use.  Limiting alcohol use.  Practicing safe sex.  Taking low-dose aspirin every day.  Taking vitamin and mineral supplements as recommended by your health care provider. What happens during an annual well check? The services and screenings done by your health care provider during your annual well check will depend on your age, overall health,  lifestyle risk factors, and family history of disease. Counseling  Your health care provider may ask you questions about your:  Alcohol use.  Tobacco use.  Drug use.  Emotional well-being.  Home and relationship well-being.  Sexual activity.  Eating habits.  History of falls.  Memory and ability to understand (cognition).  Work and work Statistician.  Reproductive health. Screening  You may have the following tests or measurements:  Height, weight, and BMI.  Blood pressure.  Lipid and cholesterol levels. These may be checked every 5 years, or more frequently if you are over 33 years old.  Skin check.  Lung cancer screening. You may have this screening every year starting at age 24 if you have a 30-pack-year history of smoking and currently smoke or have quit within the past 15 years.  Fecal occult blood test (FOBT) of the stool. You may have this test every year starting at age 79.  Flexible sigmoidoscopy or colonoscopy. You may have a sigmoidoscopy every 5 years or a colonoscopy every 10 years starting at age 9.  Hepatitis C blood test.  Hepatitis B blood test.  Sexually transmitted disease (STD) testing.  Diabetes screening. This is done by checking your blood sugar (glucose) after you have not eaten for a while (fasting). You may have this done every 1-3 years.  Bone density scan. This is done to screen for osteoporosis. You may have this done starting at age 65.  Mammogram. This may be done every 1-2 years. Talk to your health care provider about how often you should have regular mammograms. Talk with your health care provider about your test results, treatment options, and if necessary, the need for  more tests. Vaccines  Your health care provider may recommend certain vaccines, such as:  Influenza vaccine. This is recommended every year.  Tetanus, diphtheria, and acellular pertussis (Tdap, Td) vaccine. You may need a Td booster every 10 years.  Zoster  vaccine. You may need this after age 62.  Pneumococcal 13-valent conjugate (PCV13) vaccine. One dose is recommended after age 79.  Pneumococcal polysaccharide (PPSV23) vaccine. One dose is recommended after age 70. Talk to your health care provider about which screenings and vaccines you need and how often you need them. This information is not intended to replace advice given to you by your health care provider. Make sure you discuss any questions you have with your health care provider. Document Released: 07/10/2015 Document Revised: 03/02/2016 Document Reviewed: 04/14/2015 Elsevier Interactive Patient Education  2017 [employer] Prevention in the Home Falls can cause injuries. They can happen to people of all ages. There are many things you can do to make your home safe and to help prevent falls. What can I do on the outside of my home?  Regularly fix the edges of walkways and driveways and fix any cracks.  Remove anything that might make you trip as you walk through a door, such as a raised step or threshold.  Trim any bushes or trees on the path to your home.  Use bright outdoor lighting.  Clear any walking paths of anything that might make someone trip, such as rocks or tools.  Regularly check to see if handrails are loose or broken. Make sure that both sides of any steps have handrails.  Any raised decks and porches should have guardrails on the edges.  Have any leaves, snow, or ice cleared regularly.  Use sand or salt on walking paths during winter.  Clean up any spills in your garage right away. This includes oil or grease spills. What can I do in the bathroom?  Use night lights.  Install grab bars by the toilet and in the tub and shower. Do not use towel bars as grab bars.  Use non-skid mats or decals in the tub or shower.  If you need to sit down in the shower, use a plastic, non-slip stool.  Keep the floor dry. Clean up any water that spills on the  floor as soon as it happens.  Remove soap buildup in the tub or shower regularly.  Attach bath mats securely with double-sided non-slip rug tape.  Do not have throw rugs and other things on the floor that can make you trip. What can I do in the bedroom?  Use night lights.  Make sure that you have a light by your bed that is easy to reach.  Do not use any sheets or blankets that are too big for your bed. They should not hang down onto the floor.  Have a firm chair that has side arms. You can use this for support while you get dressed.  Do not have throw rugs and other things on the floor that can make you trip. What can I do in the kitchen?  Clean up any spills right away.  Avoid walking on wet floors.  Keep items that you use a lot in easy-to-reach places.  If you need to reach something above you, use a strong step stool that has a grab bar.  Keep electrical cords out of the way.  Do not use floor polish or wax that makes floors slippery. If you must use wax, use non-skid  floor wax.  Do not have throw rugs and other things on the floor that can make you trip. What can I do with my stairs?  Do not leave any items on the stairs.  Make sure that there are handrails on both sides of the stairs and use them. Fix handrails that are broken or loose. Make sure that handrails are as long as the stairways.  Check any carpeting to make sure that it is firmly attached to the stairs. Fix any carpet that is loose or worn.  Avoid having throw rugs at the top or bottom of the stairs. If you do have throw rugs, attach them to the floor with carpet tape.  Make sure that you have a light switch at the top of the stairs and the bottom of the stairs. If you do not have them, ask someone to add them for you. What else can I do to help prevent falls?  Wear shoes that:  Do not have high heels.  Have rubber bottoms.  Are comfortable and fit you well.  Are closed at the toe. Do not wear  sandals.  If you use a stepladder:  Make sure that it is fully opened. Do not climb a closed stepladder.  Make sure that both sides of the stepladder are locked into place.  Ask someone to hold it for you, if possible.  Clearly mark and make sure that you can see:  Any grab bars or handrails.  First and last steps.  Where the edge of each step is.  Use tools that help you move around (mobility aids) if they are needed. These include:  Canes.  Walkers.  Scooters.  Crutches.  Turn on the lights when you go into a dark area. Replace any light bulbs as soon as they burn out.  Set up your furniture so you have a clear path. Avoid moving your furniture around.  If any of your floors are uneven, fix them.  If there are any pets around you, be aware of where they are.  Review your medicines with your doctor. Some medicines can make you feel dizzy. This can increase your chance of falling. Ask your doctor what other things that you can do to help prevent falls. This information is not intended to replace advice given to you by your health care provider. Make sure you discuss any questions you have with your health care provider. Document Released: 04/09/2009 Document Revised: 11/19/2015 Document Reviewed: 07/18/2014 Elsevier Interactive Patient Education  2017 [employer].

## 2020-09-29 NOTE — Progress Notes (Addendum)
Virtual Visit via Telephone Note  I connected with  Kristy [name] on 09/29/20 at  3:15 PM EDT by telephone and verified that I am speaking with the correct person using two identifiers.  Medicare Annual Wellness visit completed telephonically due to Covid-19 pandemic.   Persons participating in this call: This Health Coach and this patient.   Location: Patient: Home Provider: Office   I discussed the limitations, risks, security and privacy concerns of performing an evaluation and management service by telephone and the availability of in person appointments. The patient expressed understanding and agreed to proceed.  Unable to perform video visit due to video visit attempted and failed and/or patient does not have video capability.   Some vital signs may be absent or patient reported.   Willette Brace, LPN    Subjective:   Kristy [name] is a 70 y.o. female who presents for an Initial Medicare Annual Wellness Visit.  Review of Systems     Cardiac Risk Factors include: advanced age (>76men, >17 women)     Objective:    There were no vitals filed for this visit. There is no height or weight on file to calculate BMI.  Advanced Directives 09/29/2020 11/18/2016  Does Patient Have a Medical Advance Directive? Yes No  Type of Paramedic of [place];Living will -  Copy of [place] in Chart? No - copy requested -    Current Medications (verified) Outpatient Encounter Medications as of 09/29/2020  Medication Sig  . calcium-vitamin D (OSCAL WITH D) 500-200 MG-UNIT per tablet Take 1 tablet by mouth every evening.  . Cholecalciferol (VITAMIN D-3) 1000 UNITS CAPS Take 1,000 Units by mouth daily.  . fish oil-omega-3 fatty acids 1000 MG capsule Take 1 g by mouth daily.  . fluticasone (FLONASE) 50 MCG/ACT nasal spray Place 1 spray into both nostrils daily as needed for allergies.   Marland Kitchen ibandronate (BONIVA) 150 MG tablet TAKE 1 TABLET  BY MOUTH ONCE MONTHLY  . levothyroxine (SYNTHROID) 50 MCG tablet TAKE 1 TABLET (50 MCG TOTAL) BY MOUTH DAILY.  Marland Kitchen loratadine (CLARITIN) 10 MG tablet Take 10 mg by mouth daily.  . metoprolol succinate (TOPROL-XL) 50 MG 24 hr tablet TAKE 1 TABLET BY MOUTH  DAILY WITH OR IMMEDIATELY  FOLLOWING A MEAL  . Multiple Vitamin (MULTIVITAMIN) tablet Take 1 tablet by mouth every evening.  Marland Kitchen omeprazole (PRILOSEC) 20 MG capsule Take 1 capsule (20 mg total) by mouth daily.  . simvastatin (ZOCOR) 20 MG tablet TAKE 1 TABLET (20 MG TOTAL) BY MOUTH AT BEDTIME.  . [DISCONTINUED] mirabegron ER (MYRBETRIQ) 50 MG TB24 tablet Take 50 mg by mouth daily.    Facility-Administered Encounter Medications as of 09/29/2020  Medication  . 0.9 %  sodium chloride infusion    Allergies (verified) Other, Ciprofloxacin, Levofloxacin, and Wound dressing adhesive   History: Past Medical History:  Diagnosis Date  . Allergy   . Blood in stool   . Breast cancer ([place])   . Cancer ([place]) 1998   breast  . GERD (gastroesophageal reflux disease)   . Gynecologic exam normal    sees Dr. Christophe Louis  . Hyperlipidemia   . Hypothyroidism   . Osteoporosis    last DEXA 08-20-15   . Personal history of radiation therapy   . Posterior vitreous detachment of left eye    sees Dr. Starling Manns    Past Surgical History:  Procedure Laterality Date  . ABDOMINAL HYSTERECTOMY  07/31/2020  . BLADDER SURGERY  07/31/20  . BREAST BIOPSY    . BREAST EXCISIONAL BIOPSY Right    multiple excisional bx on right from [place] to 1988  . BREAST LUMPECTOMY Left 1998   left  . COLONOSCOPY  12/05/2016   per Dr. Ardis Hughs, no polyps, repeat in 5 yrs (pos family hx)   . TONSILLECTOMY  1959   Family History  Problem Relation Age of Onset  . Colon cancer Mother 69  . Hypertension Mother   . Cancer Mother   . Colon cancer Paternal Aunt 78  . Heart disease Father   . Heart attack Father   . Hypertension Father   . ALS Other        fhx  . Arthritis Other         fhx  . Hyperlipidemia Other        fhx  . Hypertension Other        fhx  . Mental illness Other        fhx  . Heart disease Other        fhx  . Stomach cancer Neg Hx   . Breast cancer Neg Hx    Social History   Socioeconomic History  . Marital status: Married    Spouse name: Not on file  . Number of children: Not on file  . Years of education: Not on file  . Highest education level: Not on file  Occupational History  . Not on file  Tobacco Use  . Smoking status: Never Smoker  . Smokeless tobacco: Never Used  Substance and Sexual Activity  . Alcohol use: No    Alcohol/week: 0.0 standard drinks  . Drug use: No  . Sexual activity: Not on file  Other Topics Concern  . Not on file  Social History Narrative  . Not on file   Social Determinants of Health   Financial Resource Strain: Low Risk   . Difficulty of Paying Living Expenses: Not hard at all  Food Insecurity: No Food Insecurity  . Worried About Charity fundraiser in the Last Year: Never true  . Ran Out of Food in the Last Year: Never true  Transportation Needs: No Transportation Needs  . Lack of Transportation (Medical): No  . Lack of Transportation (Non-Medical): No  Physical Activity: Inactive  . Days of Exercise per Week: 0 days  . Minutes of Exercise per Session: 0 min  Stress: No Stress Concern Present  . Feeling of Stress : Not at all  Social Connections: Moderately Isolated  . Frequency of Communication with Friends and Family: More than three times a week  . Frequency of Social Gatherings with Friends and Family: Once a week  . Attends Religious Services: Never  . Active Member of Clubs or Organizations: No  . Attends Archivist Meetings: Never  . Marital Status: Married    Tobacco Counseling Counseling given: Not Answered   Clinical Intake:  Pre-visit preparation completed: Yes  Pain : No/denies pain     BMI - recorded: 20.55 Nutritional Status: BMI of 19-24   Normal Nutritional Risks: None Diabetes: No  How often do you need to have someone help you when you read instructions, pamphlets, or other written materials from your doctor or pharmacy?: 1 - Never  Diabetic?nO  Interpreter Needed?: No  Information entered by :: Charlott Rakes, LPN   Activities of Daily Living In your present state of health, do you have any difficulty performing the following activities: 09/29/2020  Hearing? N  Vision? N  Difficulty concentrating or making decisions? N  Walking or climbing stairs? N  Dressing or bathing? N  Doing errands, shopping? N  Preparing Food and eating ? N  Using the Toilet? N  In the past six months, have you accidently leaked urine? Y  Comment has since been corrected after surgery  Do you have problems with loss of bowel control? N  Managing your Medications? N  Managing your Finances? N  Housekeeping or managing your Housekeeping? N  Some recent data might be hidden    Patient Care Team: Laurey Morale, MD as PCP - General Marlou Porch Thana Farr, MD as PCP - Cardiology (Cardiology)  Indicate any recent Medical Services you may have received from other than [facility] providers in the past year (date may be approximate).     Assessment:   This is a routine wellness examination for Kristy [name].  Hearing/Vision screen  Hearing Screening   125Hz  250Hz  500Hz  1000Hz  2000Hz  3000Hz  4000Hz  6000Hz  8000Hz   Right ear:           Left ear:           Comments: Denies any hearing issues   Vision Screening Comments: Pt follows up Dr Katy Apo   Dietary issues and exercise activities discussed: Current Exercise Habits: The patient does not participate in regular exercise at present  Goals    . Patient Stated     None at this time       Depression Screen PHQ 2/9 Scores 09/29/2020  PHQ - 2 Score 0    Fall Risk Fall Risk  09/29/2020  Falls in the past year? 0  Number falls in past yr: 0  Injury with Fall? 0  Risk for fall due to : Impaired  vision  Follow up Falls prevention discussed    FALL RISK PREVENTION PERTAINING TO THE HOME:  Any stairs in or around the home? Yes  If so, are there any without handrails? No  Home free of loose throw rugs in walkways, pet beds, electrical cords, etc? Yes  Adequate lighting in your home to reduce risk of falls? Yes   ASSISTIVE DEVICES UTILIZED TO PREVENT FALLS:  Life alert? No  Use of a cane, walker or w/c? No  Grab bars in the bathroom? Yes  Shower chair or bench in shower? Yes  Elevated toilet seat or a handicapped toilet? Yes  TIMED UP AND GO:  Was the test performed? No .    Cognitive Function:     6CIT Screen 09/29/2020  What Year? 0 points  What month? 0 points  Count back from 20 0 points  Months in reverse 0 points  Repeat phrase 0 points    Immunizations Immunization History  Administered Date(s) Administered  . Fluad Quad(high Dose 65+) 03/06/2019, 03/06/2020  . Influenza Split 04/27/2012  . Influenza Whole 04/01/2008, 04/05/2009  . Influenza,inj,Quad PF,6+ Mos 03/06/2013  . Influenza,inj,quad, With Preservative 04/02/2017  . Influenza-Unspecified 06/21/2014, 04/26/2015, 04/01/2016  . PFIZER(Purple Top)SARS-COV-2 Vaccination 07/25/2019, 08/22/2019, 03/31/2020  . Pneumococcal Conjugate-13 03/06/2019  . Pneumococcal Polysaccharide-23 03/06/2020  . Tdap 07/11/2011  . Zoster 07/19/2013    TDAP status: Up to date  Flu Vaccine status: Up to date  Pneumococcal vaccine status: Up to date  Covid-19 vaccine status: Completed vaccines  Qualifies for Shingles Vaccine? Yes   Zostavax completed Yes   Shingrix Completed?: No.    Education has been provided regarding the importance of this vaccine. Patient has been advised to call insurance company  to determine out of pocket expense if they have not yet received this vaccine. Advised may also receive vaccine at local pharmacy or Health Dept. Verbalized acceptance and understanding.  Screening Tests Health  Maintenance  Topic Date Due  . Hepatitis C Screening  Never done  . INFLUENZA VACCINE  01/25/2021  . TETANUS/TDAP  07/10/2021  . COLONOSCOPY (Pts 45-84yrs Insurance coverage will need to be confirmed)  12/05/2021  . MAMMOGRAM  03/31/2022  . DEXA SCAN  Completed  . COVID-19 Vaccine  Completed  . PNA vac Low Risk Adult  Completed  . HPV VACCINES  Aged Out    Health Maintenance  Health Maintenance Due  Topic Date Due  . Hepatitis C Screening  Never done    Colorectal cancer screening: Type of screening: Colonoscopy. Completed 12/05/16. Repeat every 5 years  Mammogram status: Completed 03/31/20. Repeat every year  Bone Density status: Completed 09/15/17. Results reflect: Bone density results: OSTEOPOROSIS. Repeat every 2 years.   Additional Screening:  Hepatitis C Screening: does qualify;  Vision Screening: Recommended annual ophthalmology exams for early detection of glaucoma and other disorders of the eye. Is the patient up to date with their annual eye exam?  Yes  Who is the provider or what is the name of the office in which the patient attends annual eye exams? Dr Katy Apo  If pt is not established with a provider, would they like to be referred to a provider to establish care? No .   Dental Screening: Recommended annual dental exams for proper oral hygiene  Community Resource Referral / Chronic Care Management: CRR required this visit?  No   CCM required this visit?  No      Plan:     I have personally reviewed and noted the following in the patient's chart:   . Medical and social history . Use of alcohol, tobacco or illicit drugs  . Current medications and supplements . Functional ability and status . Nutritional status . Physical activity . Advanced directives . List of other physicians . Hospitalizations, surgeries, and ER visits in previous 12 months . Vitals . Screenings to include cognitive, depression, and falls . Referrals and appointments  In  addition, I have reviewed and discussed with patient certain preventive protocols, quality metrics, and best practice recommendations. A written personalized care plan for preventive services as well as general preventive health recommendations were provided to patient.     Willette Brace, LPN   07/02/1094   Nurse Notes: None

## 2020-11-25 ENCOUNTER — Other Ambulatory Visit: Payer: Self-pay

## 2020-11-25 ENCOUNTER — Encounter: Payer: Self-pay | Admitting: Cardiology

## 2020-11-25 ENCOUNTER — Ambulatory Visit: Payer: Medicare PPO | Admitting: Cardiology

## 2020-11-25 VITALS — BP 130/70 | HR 63 | Ht 63.0 in | Wt 112.0 lb

## 2020-11-25 DIAGNOSIS — R002 Palpitations: Secondary | ICD-10-CM | POA: Diagnosis not present

## 2020-11-25 DIAGNOSIS — R9431 Abnormal electrocardiogram [ECG] [EKG]: Secondary | ICD-10-CM

## 2020-11-25 NOTE — Patient Instructions (Signed)
Medication Instructions:  The current medical regimen is effective;  continue present plan and medications.  *If you need a refill on your cardiac medications before your next appointment, please call your pharmacy*  Testing/Procedures: Your physician has requested that you have an echocardiogram. Echocardiography is a painless test that uses sound waves to create images of your heart. It provides your doctor with information about the size and shape of your heart and how well your heart's chambers and valves are working. This procedure takes approximately one hour. There are no restrictions for this procedure.  Follow-Up: At [facility], you and your health needs are our priority.  As part of our continuing mission to provide you with exceptional heart care, we have created designated Provider Care Teams.  These Care Teams include your primary Cardiologist (physician) and Advanced Practice Providers (APPs -  Physician Assistants and Nurse Practitioners) who all work together to provide you with the care you need, when you need it.  We recommend signing up for the patient portal called "MyChart".  Sign up information is provided on this After Visit Summary.  MyChart is used to connect with patients for Virtual Visits (Telemedicine).  Patients are able to view lab/test results, encounter notes, upcoming appointments, etc.  Non-urgent messages can be sent to your provider as well.   To learn more about what you can do with MyChart, go to [email or web address].    Your next appointment:   1 year(s)  The format for your next appointment:   In Person  Provider:   Mark Skains, MD     Thank you for choosing [facility]!!    

## 2020-11-25 NOTE — Progress Notes (Signed)
Cardiology Office Note:    Date:  11/25/2020   ID:  Alys, Dulak 08-22-1950, MRN [id number]  PCP:  Laurey Morale, MD   [facility] Providers Cardiologist:  Candee Furbish, MD     Referring MD: Laurey Morale, MD     History of Present Illness:    Kristy [name] is a 70 y.o. female here for follow-up of possible atrial flutter.  Early family history of CAD with father having 3 MIs in his 31s.  Prior episodes of vasovagal syncope during blood draws.  Retired Pharmacist, hospital retired in 2009.  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  Felt some fluttering. Wore monitor in the past.   01/2018   Occasional PVCs, 3000, occasional PACs 3000, overall less than 1%  No atrial fibrillation, no adverse arrhythmias  Chest pain/other symptoms associated with sinus rhythm.  Overall reassuring monitor.    Past Medical History:  Diagnosis Date  . Allergy   . Blood in stool   . Breast cancer ([place])   . Cancer ([place]) 1998   breast  . GERD (gastroesophageal reflux disease)   . Gynecologic exam normal    sees Dr. Christophe Louis  . Hyperlipidemia   . Hypothyroidism   . Osteoporosis    last DEXA 08-20-15   . Personal history of radiation therapy   . Posterior vitreous detachment of left eye    sees Dr. Starling Manns     Past Surgical History:  Procedure Laterality Date  . ABDOMINAL HYSTERECTOMY  07/31/2020  . BLADDER SURGERY     07/31/20  . BREAST BIOPSY    . BREAST EXCISIONAL BIOPSY Right    multiple excisional bx on right from [place] to 1988  . BREAST LUMPECTOMY Left 1998   left  . COLONOSCOPY  12/05/2016   per Dr. Ardis Hughs, no polyps, repeat in 5 yrs (pos family hx)   . TONSILLECTOMY  1959    Current Medications: Current Meds  Medication Sig  . calcium-vitamin D (OSCAL WITH D) 500-200 MG-UNIT per tablet Take 1 tablet by mouth every evening.  . Cholecalciferol (VITAMIN D-3) 1000 UNITS CAPS Take 1,000 Units by mouth  daily.  . fish oil-omega-3 fatty acids 1000 MG capsule Take 1 g by mouth daily.  . fluticasone (FLONASE) 50 MCG/ACT nasal spray Place 1 spray into both nostrils daily as needed for allergies.   Marland Kitchen ibandronate (BONIVA) 150 MG tablet TAKE 1 TABLET BY MOUTH ONCE MONTHLY  . levothyroxine (SYNTHROID) 50 MCG tablet TAKE 1 TABLET (50 MCG TOTAL) BY MOUTH DAILY.  Marland Kitchen loratadine (CLARITIN) 10 MG tablet Take 10 mg by mouth daily.  . metoprolol succinate (TOPROL-XL) 50 MG 24 hr tablet TAKE 1 TABLET BY MOUTH  DAILY WITH OR IMMEDIATELY  FOLLOWING A MEAL  . Multiple Vitamin (MULTIVITAMIN) tablet Take 1 tablet by mouth every evening.  Marland Kitchen omeprazole (PRILOSEC) 20 MG capsule Take 1 capsule (20 mg total) by mouth daily.  . simvastatin (ZOCOR) 20 MG tablet TAKE 1 TABLET (20 MG TOTAL) BY MOUTH AT BEDTIME.     Allergies:   Other, Ciprofloxacin, Levofloxacin, and Wound dressing adhesive   Social History   Socioeconomic History  . Marital status: Married    Spouse name: Not on file  . Number of children: Not on file  . Years of education: Not on file  . Highest education level: Not on file  Occupational History  . Not on file  Tobacco Use  . Smoking  status: Never Smoker  . Smokeless tobacco: Never Used  Substance and Sexual Activity  . Alcohol use: No    Alcohol/week: 0.0 standard drinks  . Drug use: No  . Sexual activity: Not on file  Other Topics Concern  . Not on file  Social History Narrative  . Not on file   Social Determinants of Health   Financial Resource Strain: Low Risk   . Difficulty of Paying Living Expenses: Not hard at all  Food Insecurity: No Food Insecurity  . Worried About Charity fundraiser in the Last Year: Never true  . Ran Out of Food in the Last Year: Never true  Transportation Needs: No Transportation Needs  . Lack of Transportation (Medical): No  . Lack of Transportation (Non-Medical): No  Physical Activity: Inactive  . Days of Exercise per Week: 0 days  . Minutes of  Exercise per Session: 0 min  Stress: No Stress Concern Present  . Feeling of Stress : Not at all  Social Connections: Moderately Isolated  . Frequency of Communication with Friends and Family: More than three times a week  . Frequency of Social Gatherings with Friends and Family: Once a week  . Attends Religious Services: Never  . Active Member of Clubs or Organizations: No  . Attends Archivist Meetings: Never  . Marital Status: Married     Family History: The patient's family history includes ALS in an other family member; Arthritis in an other family member; Cancer in her mother; Colon cancer (age of onset: 67) in her mother; Colon cancer (age of onset: 56) in her paternal aunt; Heart attack in her father; Heart disease in her father and another family member; Hyperlipidemia in an other family member; Hypertension in her father, mother, and another family member; Mental illness in an other family member. There is no history of Stomach cancer or Breast cancer.  ROS:   Please see the history of present illness.     All other systems reviewed and are negative.  EKGs/Labs/Other Studies Reviewed:    The following studies were reviewed today: Echocardiogram ordered  EKG:  EKG is  ordered today.  The ekg ordered today demonstrates SR 63  Recent Labs: 03/06/2020: ALT 13; BUN 10; Creat 0.71; Hemoglobin 13.3; Platelets 273; Potassium 4.6; Sodium 140; TSH 1.56  Recent Lipid Panel    Component Value Date/Time   CHOL 184 03/06/2020 1128   TRIG 83 03/06/2020 1128   TRIG 61 05/17/2006 0904   HDL 72 03/06/2020 1128   CHOLHDL 2.6 03/06/2020 1128   VLDL 15.0 03/06/2019 1104   LDLCALC 95 03/06/2020 1128   LDLDIRECT 132.6 06/18/2010 0801     Risk Assessment/Calculations:      Physical Exam:    VS:  BP 130/70 (BP Location: Left Arm, Patient Position: Sitting, Cuff Size: Normal)   Pulse 63   Ht 5\' 3"  (1.6 m)   Wt 112 lb (50.8 kg)   SpO2 98%   BMI 19.84 kg/m     Wt  Readings from Last 3 Encounters:  11/25/20 112 lb (50.8 kg)  03/06/20 116 lb (52.6 kg)  02/04/20 120 lb 12.8 oz (54.8 kg)     GEN:  Well nourished, well developed in no acute distress HEENT: Normal NECK: No JVD; No carotid bruits LYMPHATICS: No lymphadenopathy CARDIAC: RRR, no murmurs, rubs, gallops RESPIRATORY:  Clear to auscultation without rales, wheezing or rhonchi  ABDOMEN: Soft, non-tender, non-distended MUSCULOSKELETAL:  No edema; No deformity  SKIN: Warm and dry  NEUROLOGIC:  Alert and oriented x 3 PSYCHIATRIC:  Normal affect   ASSESSMENT:    1. Abnormal EKG   2. Palpitations   3. Nonspecific abnormal electrocardiogram (ECG) (EKG)    PLAN:    In order of problems listed above:  PVCs/palpitations/abnormal EKG from [facility] - Seen previously on event monitor.  Several per day.  These seem to dissipate and then in the last several weeks had returned.  We will check an echocardiogram to ensure proper structure and function of her heart -Continue with low-dose metoprolol 50 mg once a day.  No changes made.  Hyperlipidemia - Continue with simvastatin 20 mg daily refills as needed for medication management.  No myalgias.  Doing well.  Last LDL 95.     Medication Adjustments/Labs and Tests Ordered: Current medicines are reviewed at length with the patient today.  Concerns regarding medicines are outlined above.  Orders Placed This Encounter  Procedures  . EKG 12-Lead  . ECHOCARDIOGRAM COMPLETE   No orders of the defined types were placed in this encounter.   Patient Instructions  Medication Instructions:  The current medical regimen is effective;  continue present plan and medications.  *If you need a refill on your cardiac medications before your next appointment, please call your pharmacy*  Testing/Procedures: Your physician has requested that you have an echocardiogram. Echocardiography is a painless test that uses sound waves to create images of your  heart. It provides your doctor with information about the size and shape of your heart and how well your heart's chambers and valves are working. This procedure takes approximately one hour. There are no restrictions for this procedure.  Follow-Up: At [facility], you and your health needs are our priority.  As part of our continuing mission to provide you with exceptional heart care, we have created designated Provider Care Teams.  These Care Teams include your primary Cardiologist (physician) and Advanced Practice Providers (APPs -  Physician Assistants and Nurse Practitioners) who all work together to provide you with the care you need, when you need it.  We recommend signing up for the patient portal called "MyChart".  Sign up information is provided on this After Visit Summary.  MyChart is used to connect with patients for Virtual Visits (Telemedicine).  Patients are able to view lab/test results, encounter notes, upcoming appointments, etc.  Non-urgent messages can be sent to your provider as well.   To learn more about what you can do with MyChart, go to [email or web address].    Your next appointment:   1 year(s)  The format for your next appointment:   In Person  Provider:   Candee Furbish, MD  Thank you for choosing [facility]!!        Signed, Candee Furbish, MD  11/25/2020 4:48 PM    [place]

## 2020-12-24 ENCOUNTER — Other Ambulatory Visit: Payer: Self-pay

## 2020-12-24 ENCOUNTER — Ambulatory Visit (HOSPITAL_COMMUNITY): Payer: Medicare PPO | Attending: Cardiovascular Disease

## 2020-12-24 DIAGNOSIS — R9431 Abnormal electrocardiogram [ECG] [EKG]: Secondary | ICD-10-CM | POA: Diagnosis not present

## 2020-12-24 LAB — ECHOCARDIOGRAM COMPLETE
Area-P 1/2: 3.85 cm2
S' Lateral: 2.6 cm

## 2021-01-04 DIAGNOSIS — L308 Other specified dermatitis: Secondary | ICD-10-CM | POA: Diagnosis not present

## 2021-03-02 ENCOUNTER — Other Ambulatory Visit: Payer: Self-pay | Admitting: Family Medicine

## 2021-03-19 DIAGNOSIS — Z01419 Encounter for gynecological examination (general) (routine) without abnormal findings: Secondary | ICD-10-CM | POA: Diagnosis not present

## 2021-03-19 DIAGNOSIS — Z90710 Acquired absence of both cervix and uterus: Secondary | ICD-10-CM | POA: Diagnosis not present

## 2021-03-19 DIAGNOSIS — Z853 Personal history of malignant neoplasm of breast: Secondary | ICD-10-CM | POA: Diagnosis not present

## 2021-03-19 DIAGNOSIS — Z6820 Body mass index (BMI) 20.0-20.9, adult: Secondary | ICD-10-CM | POA: Diagnosis not present

## 2021-03-25 DIAGNOSIS — B9561 Methicillin susceptible Staphylococcus aureus infection as the cause of diseases classified elsewhere: Secondary | ICD-10-CM | POA: Diagnosis not present

## 2021-03-25 DIAGNOSIS — Z808 Family history of malignant neoplasm of other organs or systems: Secondary | ICD-10-CM | POA: Diagnosis not present

## 2021-03-25 DIAGNOSIS — L814 Other melanin hyperpigmentation: Secondary | ICD-10-CM | POA: Diagnosis not present

## 2021-03-25 DIAGNOSIS — L821 Other seborrheic keratosis: Secondary | ICD-10-CM | POA: Diagnosis not present

## 2021-03-25 DIAGNOSIS — L708 Other acne: Secondary | ICD-10-CM | POA: Diagnosis not present

## 2021-03-25 DIAGNOSIS — L308 Other specified dermatitis: Secondary | ICD-10-CM | POA: Diagnosis not present

## 2021-03-25 DIAGNOSIS — D225 Melanocytic nevi of trunk: Secondary | ICD-10-CM | POA: Diagnosis not present

## 2021-04-05 DIAGNOSIS — Z1231 Encounter for screening mammogram for malignant neoplasm of breast: Secondary | ICD-10-CM | POA: Diagnosis not present

## 2021-04-08 DIAGNOSIS — M810 Age-related osteoporosis without current pathological fracture: Secondary | ICD-10-CM | POA: Diagnosis not present

## 2021-04-08 DIAGNOSIS — E785 Hyperlipidemia, unspecified: Secondary | ICD-10-CM | POA: Diagnosis not present

## 2021-04-08 DIAGNOSIS — Z23 Encounter for immunization: Secondary | ICD-10-CM | POA: Diagnosis not present

## 2021-04-08 DIAGNOSIS — Z0000 Encounter for general adult medical examination without abnormal findings: Secondary | ICD-10-CM | POA: Diagnosis not present

## 2021-04-08 DIAGNOSIS — Z853 Personal history of malignant neoplasm of breast: Secondary | ICD-10-CM | POA: Diagnosis not present

## 2021-04-08 DIAGNOSIS — Z1211 Encounter for screening for malignant neoplasm of colon: Secondary | ICD-10-CM | POA: Diagnosis not present

## 2021-04-08 DIAGNOSIS — J309 Allergic rhinitis, unspecified: Secondary | ICD-10-CM | POA: Diagnosis not present

## 2021-04-08 DIAGNOSIS — E039 Hypothyroidism, unspecified: Secondary | ICD-10-CM | POA: Diagnosis not present

## 2021-04-08 DIAGNOSIS — K219 Gastro-esophageal reflux disease without esophagitis: Secondary | ICD-10-CM | POA: Diagnosis not present

## 2021-04-14 DIAGNOSIS — M810 Age-related osteoporosis without current pathological fracture: Secondary | ICD-10-CM | POA: Diagnosis not present

## 2021-09-04 DIAGNOSIS — J0100 Acute maxillary sinusitis, unspecified: Secondary | ICD-10-CM | POA: Diagnosis not present

## 2021-09-22 DIAGNOSIS — Z800 Family history of malignant neoplasm of digestive organs: Secondary | ICD-10-CM | POA: Diagnosis not present

## 2021-09-22 DIAGNOSIS — N649 Disorder of breast, unspecified: Secondary | ICD-10-CM | POA: Diagnosis not present

## 2021-09-22 DIAGNOSIS — Z86010 Personal history of colonic polyps: Secondary | ICD-10-CM | POA: Diagnosis not present

## 2021-09-22 DIAGNOSIS — K219 Gastro-esophageal reflux disease without esophagitis: Secondary | ICD-10-CM | POA: Diagnosis not present

## 2021-10-05 ENCOUNTER — Ambulatory Visit: Payer: Medicare PPO

## 2021-10-13 DIAGNOSIS — M810 Age-related osteoporosis without current pathological fracture: Secondary | ICD-10-CM | POA: Diagnosis not present

## 2021-10-13 DIAGNOSIS — E785 Hyperlipidemia, unspecified: Secondary | ICD-10-CM | POA: Diagnosis not present

## 2021-10-13 DIAGNOSIS — K219 Gastro-esophageal reflux disease without esophagitis: Secondary | ICD-10-CM | POA: Diagnosis not present

## 2021-10-13 DIAGNOSIS — E039 Hypothyroidism, unspecified: Secondary | ICD-10-CM | POA: Diagnosis not present

## 2021-10-13 DIAGNOSIS — I4729 Other ventricular tachycardia: Secondary | ICD-10-CM | POA: Diagnosis not present

## 2021-10-13 DIAGNOSIS — N649 Disorder of breast, unspecified: Secondary | ICD-10-CM | POA: Diagnosis not present

## 2021-10-20 DIAGNOSIS — D492 Neoplasm of unspecified behavior of bone, soft tissue, and skin: Secondary | ICD-10-CM | POA: Diagnosis not present

## 2021-10-20 DIAGNOSIS — L729 Follicular cyst of the skin and subcutaneous tissue, unspecified: Secondary | ICD-10-CM | POA: Diagnosis not present

## 2021-10-20 DIAGNOSIS — L821 Other seborrheic keratosis: Secondary | ICD-10-CM | POA: Diagnosis not present

## 2021-11-12 ENCOUNTER — Encounter: Payer: Self-pay | Admitting: Gastroenterology

## 2021-12-17 ENCOUNTER — Telehealth: Payer: Self-pay | Admitting: Cardiology
